# Patient Record
Sex: Female | Born: 1935
Health system: Southern US, Community
[De-identification: ages and names within clinical notes are randomized; demographics above are authoritative.]

## PROBLEM LIST (undated history)

## (undated) DIAGNOSIS — I10 Essential (primary) hypertension: Secondary | ICD-10-CM

## (undated) DIAGNOSIS — H269 Unspecified cataract: Secondary | ICD-10-CM

## (undated) DIAGNOSIS — G40909 Epilepsy, unspecified, not intractable, without status epilepticus: Secondary | ICD-10-CM

## (undated) DIAGNOSIS — M1990 Unspecified osteoarthritis, unspecified site: Secondary | ICD-10-CM

## (undated) DIAGNOSIS — R569 Unspecified convulsions: Secondary | ICD-10-CM

## (undated) HISTORY — DX: Unspecified cataract: H26.9

## (undated) HISTORY — DX: Essential (primary) hypertension: I10

## (undated) HISTORY — PX: TONSILLECTOMY: SUR1361

## (undated) HISTORY — DX: Unspecified osteoarthritis, unspecified site: M19.90

## (undated) HISTORY — DX: Unspecified convulsions: R56.9

## (undated) HISTORY — DX: Epilepsy, unspecified, not intractable, without status epilepticus: G40.909

---

## 2002-08-03 ENCOUNTER — Encounter: Payer: Self-pay | Admitting: *Deleted

## 2002-08-03 ENCOUNTER — Emergency Department (HOSPITAL_COMMUNITY): Admission: EM | Admit: 2002-08-03 | Discharge: 2002-08-03 | Payer: Self-pay | Admitting: *Deleted

## 2004-11-09 ENCOUNTER — Emergency Department (HOSPITAL_COMMUNITY): Admission: EM | Admit: 2004-11-09 | Discharge: 2004-11-10 | Payer: Self-pay | Admitting: Emergency Medicine

## 2004-11-16 ENCOUNTER — Ambulatory Visit: Payer: Self-pay | Admitting: Family Medicine

## 2004-11-21 ENCOUNTER — Encounter: Admission: RE | Admit: 2004-11-21 | Discharge: 2004-11-21 | Payer: Self-pay | Admitting: Family Medicine

## 2005-03-05 ENCOUNTER — Ambulatory Visit: Payer: Self-pay | Admitting: Family Medicine

## 2005-03-25 ENCOUNTER — Ambulatory Visit (HOSPITAL_COMMUNITY): Admission: RE | Admit: 2005-03-25 | Discharge: 2005-03-25 | Payer: Self-pay | Admitting: Family Medicine

## 2005-12-10 ENCOUNTER — Ambulatory Visit: Payer: Self-pay | Admitting: Family Medicine

## 2005-12-10 LAB — CONVERTED CEMR LAB
ALT: 14 units/L (ref 0–40)
AST: 19 units/L (ref 0–37)
Albumin: 3.9 g/dL (ref 3.5–5.2)
Alkaline Phosphatase: 77 units/L (ref 39–117)
BUN: 30 mg/dL — ABNORMAL HIGH (ref 6–23)
Basophils Absolute: 0.1 10*3/uL (ref 0.0–0.1)
Basophils Relative: 2.4 % — ABNORMAL HIGH (ref 0.0–1.0)
CO2: 28 meq/L (ref 19–32)
Calcium: 9.3 mg/dL (ref 8.4–10.5)
Chloride: 103 meq/L (ref 96–112)
Chol/HDL Ratio, serum: 3.5
Cholesterol: 150 mg/dL (ref 0–200)
Creatinine, Ser: 1.1 mg/dL (ref 0.4–1.2)
Eosinophil percent: 3.7 % (ref 0.0–5.0)
GFR calc non Af Amer: 52 mL/min
Glomerular Filtration Rate, Af Am: 64 mL/min/{1.73_m2}
Glucose, Bld: 110 mg/dL — ABNORMAL HIGH (ref 70–99)
HCT: 42.5 % (ref 36.0–46.0)
HDL: 42.9 mg/dL (ref >39.0)
Hemoglobin: 13.9 g/dL (ref 12.0–15.0)
Hgb A1c MFr Bld: 6.1 % — ABNORMAL HIGH (ref 4.6–6.0)
LDL Cholesterol: 100 mg/dL — ABNORMAL HIGH (ref 0–99)
Lymphocytes Relative: 36.8 % (ref 12.0–46.0)
MCHC: 32.6 g/dL (ref 30.0–36.0)
MCV: 91 fL (ref 78.0–100.0)
Monocytes Absolute: 0.5 10*3/uL (ref 0.2–0.7)
Monocytes Relative: 10 % (ref 3.0–11.0)
Neutro Abs: 2.2 10*3/uL (ref 1.4–7.7)
Neutrophils Relative %: 47.1 % (ref 43.0–77.0)
Platelets: 182 10*3/uL (ref 150–400)
Potassium: 4 meq/L (ref 3.5–5.1)
RBC: 4.67 M/uL (ref 3.87–5.11)
RDW: 11.7 % (ref 11.5–14.6)
Sodium: 140 meq/L (ref 135–145)
TSH: 2.12 microintl units/mL (ref 0.35–5.50)
Total Bilirubin: 0.7 mg/dL (ref 0.3–1.2)
Total Protein: 7.1 g/dL (ref 6.0–8.3)
Triglyceride fasting, serum: 38 mg/dL (ref 0–149)
VLDL: 8 mg/dL (ref 0–40)
WBC: 4.8 10*3/uL (ref 4.5–10.5)

## 2005-12-14 ENCOUNTER — Ambulatory Visit: Payer: Self-pay | Admitting: Family Medicine

## 2005-12-14 ENCOUNTER — Emergency Department (HOSPITAL_COMMUNITY): Admission: EM | Admit: 2005-12-14 | Discharge: 2005-12-14 | Payer: Self-pay | Admitting: Emergency Medicine

## 2006-04-12 ENCOUNTER — Encounter: Admission: RE | Admit: 2006-04-12 | Discharge: 2006-04-12 | Payer: Self-pay | Admitting: Neurology

## 2006-04-18 ENCOUNTER — Ambulatory Visit: Payer: Self-pay | Admitting: Family Medicine

## 2006-09-09 ENCOUNTER — Ambulatory Visit: Payer: Self-pay | Admitting: Family Medicine

## 2006-09-09 LAB — CONVERTED CEMR LAB
Bilirubin Urine: NEGATIVE
Blood in Urine, dipstick: NEGATIVE
Glucose, Urine, Semiquant: NEGATIVE
Ketones, urine, test strip: NEGATIVE
Nitrite: NEGATIVE
Protein, U semiquant: NEGATIVE
Specific Gravity, Urine: 1.025
Urobilinogen, UA: 0.2
WBC Urine, dipstick: NEGATIVE
pH: 5

## 2006-09-13 LAB — CONVERTED CEMR LAB
ALT: 12 units/L (ref 0–35)
AST: 16 units/L (ref 0–37)
Albumin: 3.6 g/dL (ref 3.5–5.2)
Alkaline Phosphatase: 76 units/L (ref 39–117)
BUN: 22 mg/dL (ref 6–23)
Basophils Absolute: 0 10*3/uL (ref 0.0–0.1)
Basophils Relative: 0.4 % (ref 0.0–1.0)
Bilirubin, Direct: 0.1 mg/dL (ref 0.0–0.3)
CO2: 29 meq/L (ref 19–32)
Calcium: 9.2 mg/dL (ref 8.4–10.5)
Chloride: 104 meq/L (ref 96–112)
Cholesterol: 152 mg/dL (ref 0–200)
Creatinine, Ser: 1.1 mg/dL (ref 0.4–1.2)
Eosinophils Absolute: 0.2 10*3/uL (ref 0.0–0.6)
Eosinophils Relative: 3.4 % (ref 0.0–5.0)
GFR calc Af Amer: 63 mL/min
GFR calc non Af Amer: 52 mL/min
Glucose, Bld: 128 mg/dL — ABNORMAL HIGH (ref 70–99)
HCT: 38.6 % (ref 36.0–46.0)
HDL: 39.1 mg/dL (ref >39.0)
Hemoglobin: 12.9 g/dL (ref 12.0–15.0)
LDL Cholesterol: 103 mg/dL — ABNORMAL HIGH (ref 0–99)
Lymphocytes Relative: 31.7 % (ref 12.0–46.0)
MCHC: 33.5 g/dL (ref 30.0–36.0)
MCV: 89.5 fL (ref 78.0–100.0)
Monocytes Absolute: 0.6 10*3/uL (ref 0.2–0.7)
Monocytes Relative: 12 % — ABNORMAL HIGH (ref 3.0–11.0)
Neutro Abs: 2.8 10*3/uL (ref 1.4–7.7)
Neutrophils Relative %: 52.5 % (ref 43.0–77.0)
Platelets: 222 10*3/uL (ref 150–400)
Potassium: 3.6 meq/L (ref 3.5–5.1)
RBC: 4.31 M/uL (ref 3.87–5.11)
RDW: 11.9 % (ref 11.5–14.6)
Sodium: 139 meq/L (ref 135–145)
TSH: 2.44 microintl units/mL (ref 0.35–5.50)
Total Bilirubin: 0.8 mg/dL (ref 0.3–1.2)
Total CHOL/HDL Ratio: 3.9
Total Protein: 6.7 g/dL (ref 6.0–8.3)
Triglycerides: 51 mg/dL (ref 0–149)
VLDL: 10 mg/dL (ref 0–40)
WBC: 5.3 10*3/uL (ref 4.5–10.5)

## 2007-01-18 ENCOUNTER — Ambulatory Visit: Payer: Self-pay | Admitting: Family Medicine

## 2007-01-18 DIAGNOSIS — I10 Essential (primary) hypertension: Secondary | ICD-10-CM | POA: Insufficient documentation

## 2007-01-18 DIAGNOSIS — M1990 Unspecified osteoarthritis, unspecified site: Secondary | ICD-10-CM

## 2007-03-20 ENCOUNTER — Ambulatory Visit: Payer: Self-pay | Admitting: Family Medicine

## 2007-07-06 ENCOUNTER — Telehealth: Payer: Self-pay | Admitting: Family Medicine

## 2007-07-07 ENCOUNTER — Ambulatory Visit: Payer: Self-pay | Admitting: Family Medicine

## 2008-03-22 IMAGING — CR DG CERVICAL SPINE COMPLETE 4+V
7 series · 7 of 7 positions shown · non-contrast
Comparison: None.

CLINICAL DATA: Bilateral neck and shoulder pain, especially turning head.  No specific injury.
 CERVICAL SPINE ? FIVE VIEWS:

[view not recorded (1 of 7)]
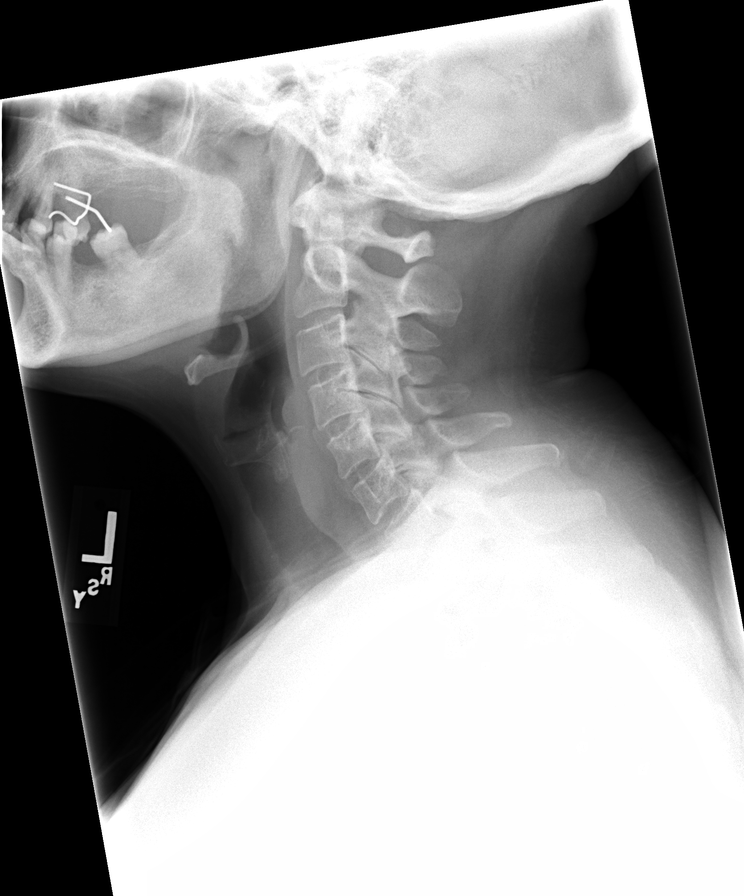

[view not recorded (2 of 7)]
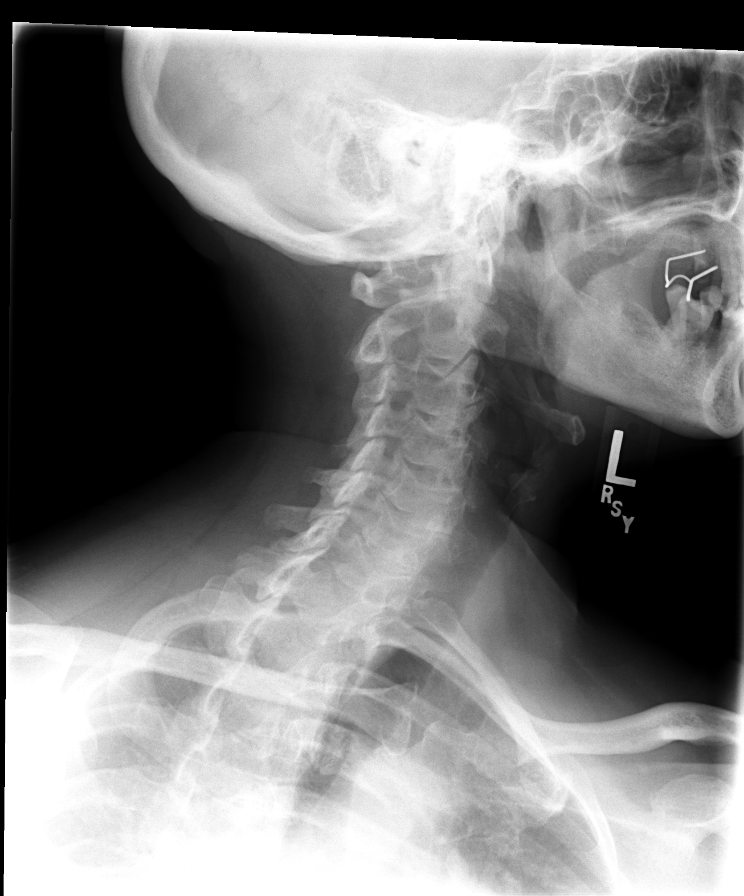

[view not recorded (3 of 7)]
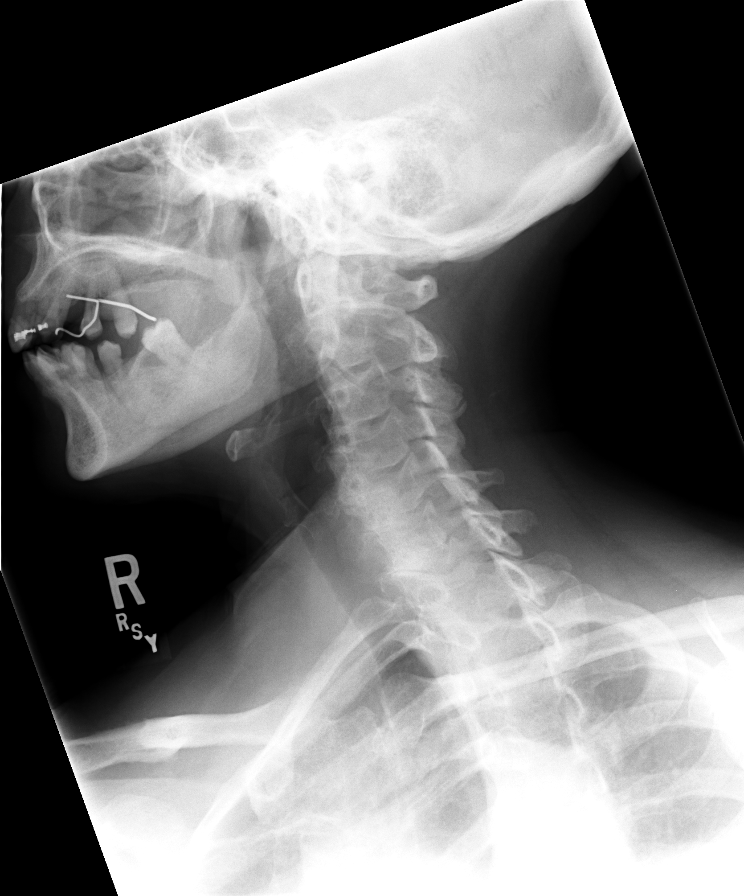

[view not recorded (4 of 7)]
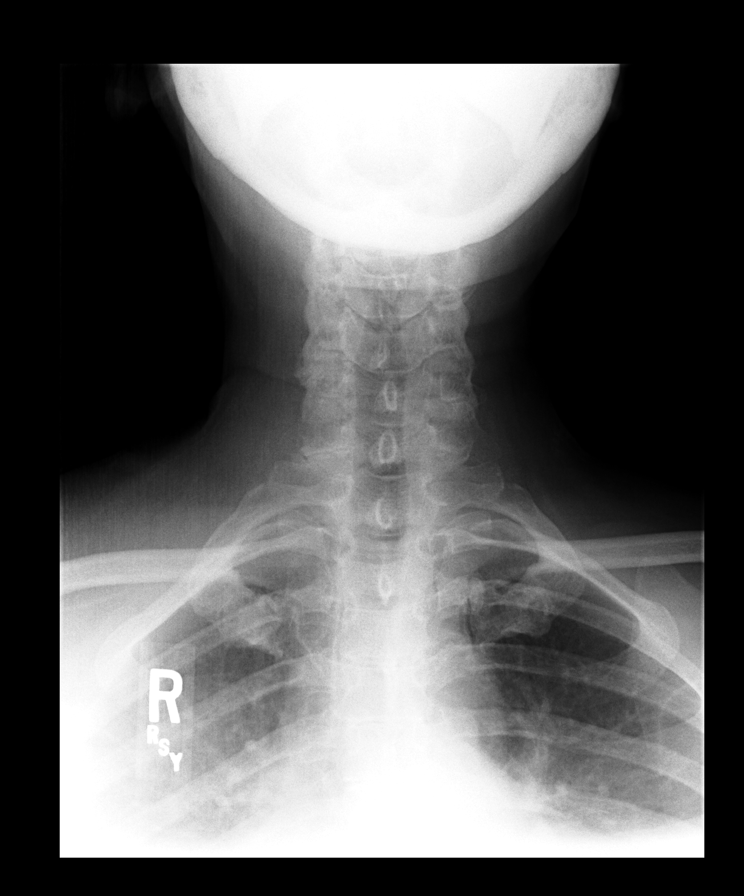

[view not recorded (5 of 7)]
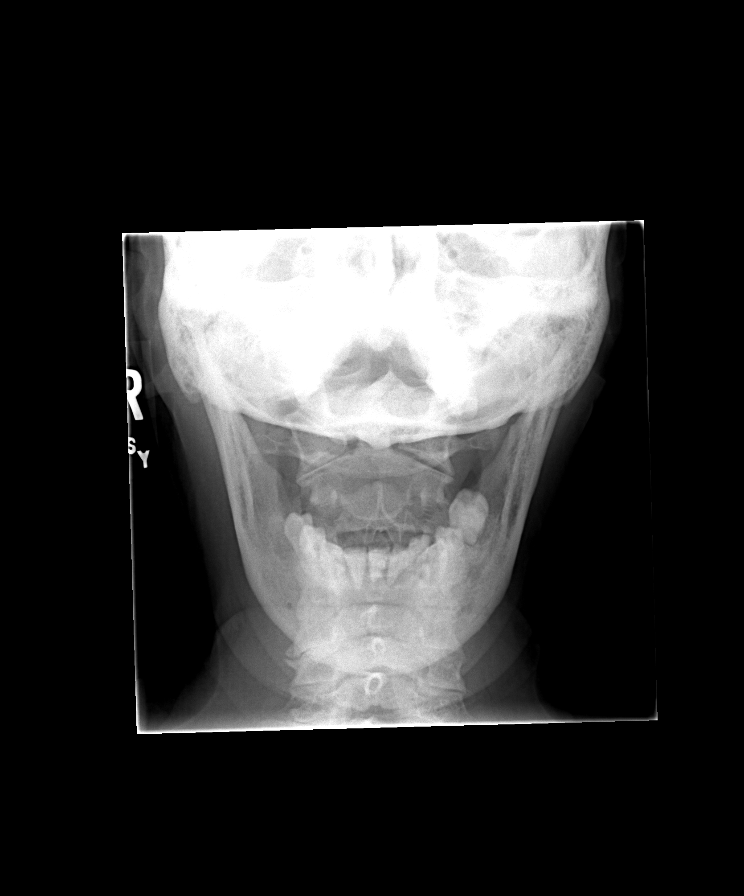

[view not recorded (6 of 7)]
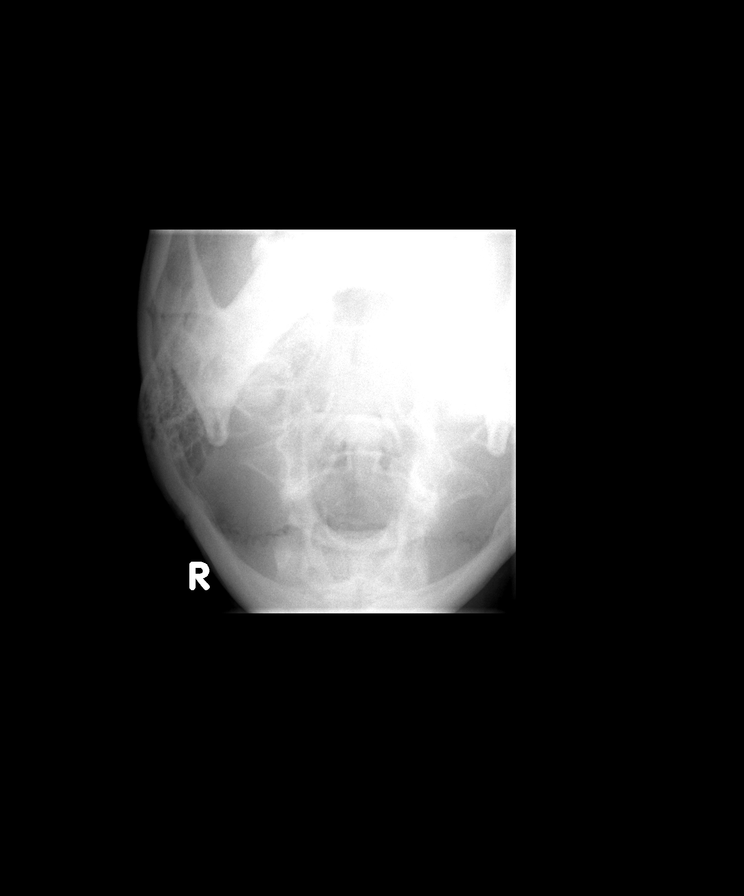

[view not recorded (7 of 7)]
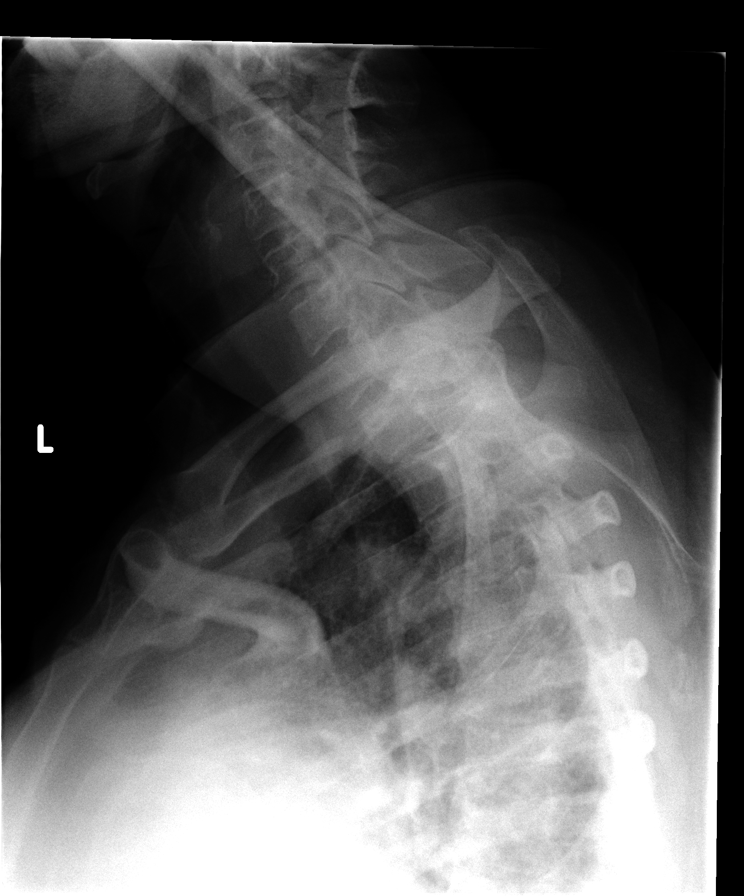

[7 of 7 positions shown; findings below may reference images not displayed]

FINDINGS: Cervical disk spaces and posterior vertebral alignment appear maintained with no abnormal prevertebral soft tissue swelling.  Right greater than left mid cervical facet degenerative joint disease is seen with slight uncinate degenerative joint disease at C6-7.  No significant neural foraminal stenosis is noted.
IMPRESSION: 1.  Slight facet and uncinate degenerative joint disease, as described.
 2.  Otherwise no significant abnormality.

## 2008-07-10 ENCOUNTER — Ambulatory Visit (HOSPITAL_COMMUNITY): Admission: RE | Admit: 2008-07-10 | Discharge: 2008-07-10 | Payer: Self-pay | Admitting: Family Medicine

## 2009-02-25 ENCOUNTER — Ambulatory Visit: Payer: Self-pay | Admitting: Family Medicine

## 2009-02-25 DIAGNOSIS — R569 Unspecified convulsions: Secondary | ICD-10-CM

## 2009-02-26 ENCOUNTER — Encounter (INDEPENDENT_AMBULATORY_CARE_PROVIDER_SITE_OTHER): Payer: Self-pay | Admitting: *Deleted

## 2009-03-11 ENCOUNTER — Telehealth: Payer: Self-pay | Admitting: Family Medicine

## 2009-03-25 ENCOUNTER — Encounter (INDEPENDENT_AMBULATORY_CARE_PROVIDER_SITE_OTHER): Payer: Self-pay | Admitting: *Deleted

## 2009-03-26 ENCOUNTER — Ambulatory Visit: Payer: Self-pay | Admitting: Gastroenterology

## 2009-04-16 ENCOUNTER — Ambulatory Visit: Payer: Self-pay | Admitting: Gastroenterology

## 2009-04-16 ENCOUNTER — Telehealth: Payer: Self-pay | Admitting: Gastroenterology

## 2009-07-11 ENCOUNTER — Ambulatory Visit (HOSPITAL_COMMUNITY): Admission: RE | Admit: 2009-07-11 | Discharge: 2009-07-11 | Payer: Self-pay | Admitting: Family Medicine

## 2009-07-11 LAB — HM MAMMOGRAPHY

## 2009-08-06 ENCOUNTER — Telehealth: Payer: Self-pay | Admitting: Family Medicine

## 2010-02-08 LAB — CONVERTED CEMR LAB
ALT: 14 units/L (ref 0–35)
AST: 16 units/L (ref 0–37)
Albumin: 3.9 g/dL (ref 3.5–5.2)
Alkaline Phosphatase: 76 units/L (ref 39–117)
BUN: 25 mg/dL — ABNORMAL HIGH (ref 6–23)
Basophils Absolute: 0 10*3/uL (ref 0.0–0.1)
Basophils Relative: 0.6 % (ref 0.0–3.0)
Bilirubin Urine: NEGATIVE
Bilirubin, Direct: 0.1 mg/dL (ref 0.0–0.3)
Blood in Urine, dipstick: NEGATIVE
CO2: 32 meq/L (ref 19–32)
Calcium: 9.9 mg/dL (ref 8.4–10.5)
Chloride: 104 meq/L (ref 96–112)
Cholesterol: 153 mg/dL (ref 0–200)
Creatinine, Ser: 1.1 mg/dL (ref 0.4–1.2)
Eosinophils Absolute: 0.2 10*3/uL (ref 0.0–0.7)
Eosinophils Relative: 3.4 % (ref 0.0–5.0)
GFR calc non Af Amer: 62.82 mL/min (ref >60)
Glucose, Bld: 131 mg/dL — ABNORMAL HIGH (ref 70–99)
Glucose, Urine, Semiquant: NEGATIVE
HCT: 41.1 % (ref 36.0–46.0)
HDL: 44.7 mg/dL (ref >39.00)
Hemoglobin: 13.2 g/dL (ref 12.0–15.0)
Ketones, urine, test strip: NEGATIVE
LDL Cholesterol: 96 mg/dL (ref 0–99)
Lymphocytes Relative: 34.4 % (ref 12.0–46.0)
Lymphs Abs: 1.8 10*3/uL (ref 0.7–4.0)
MCHC: 32.1 g/dL (ref 30.0–36.0)
MCV: 92.6 fL (ref 78.0–100.0)
Monocytes Absolute: 0.6 10*3/uL (ref 0.1–1.0)
Monocytes Relative: 11.6 % (ref 3.0–12.0)
Neutro Abs: 2.5 10*3/uL (ref 1.4–7.7)
Neutrophils Relative %: 50 % (ref 43.0–77.0)
Nitrite: NEGATIVE
Platelets: 209 10*3/uL (ref 150.0–400.0)
Potassium: 4 meq/L (ref 3.5–5.1)
Protein, U semiquant: NEGATIVE
RBC: 4.44 M/uL (ref 3.87–5.11)
RDW: 12 % (ref 11.5–14.6)
Sodium: 142 meq/L (ref 135–145)
Specific Gravity, Urine: 1.025
TSH: 2.21 microintl units/mL (ref 0.35–5.50)
Total Bilirubin: 0.5 mg/dL (ref 0.3–1.2)
Total CHOL/HDL Ratio: 3
Total Protein: 7.3 g/dL (ref 6.0–8.3)
Triglycerides: 63 mg/dL (ref 0.0–149.0)
Urobilinogen, UA: 0.2
VLDL: 12.6 mg/dL (ref 0.0–40.0)
WBC Urine, dipstick: NEGATIVE
WBC: 5.1 10*3/uL (ref 4.5–10.5)
pH: 5

## 2010-02-10 NOTE — Progress Notes (Signed)
Summary: Rx  Phone Note Call from Patient   Caller: Patient Call For: Nelwyn Salisbury MD Summary of Call: wants order [facility]com[facility] Initial call taken by: VM  Follow-up for Phone Call        she can buy these OTC without a rx Follow-up by: Nelwyn Salisbury MD,  August 06, 2009 4:37 PM  Additional Follow-up for Phone Call Additional follow up Details #1::        Phone Call Completed Additional Follow-up by: Raechel Ache, RN,  August 06, 2009 4:45 PM

## 2010-02-10 NOTE — Progress Notes (Signed)
Summary: cx fee?  Phone Note Call from Patient   Caller: Patient Call For: Dr. Russella Dar Reason for Call: Talk to Doctor Summary of Call: pt cx'ed  COL for tomorrow at 9:30am... reason is that she "just isnt up to it"... informed pt that she may be charged a cx fee and pt replied "if Im charged, Im charged" Dr. Russella Dar, would you like to charge this pt a cx fee? Initial call taken by: Vallarie Mare,  April 16, 2009 11:06 AM  Follow-up for Phone Call        Yes Follow-up by: Meryl Dare MD Clementeen Graham,  April 16, 2009 11:59 AM  Additional Follow-up for Phone Call Additional follow up Details #1::        Patient BILLED. Additional Follow-up by: Leanor Kail [facility],  April 17, 2009 2:08 PM

## 2010-02-10 NOTE — Progress Notes (Signed)
Summary: reaction to metformin?  Phone Note Call from Patient Call back at [facility] Phone [phone]   Caller: Patient Call For: fry Summary of Call: pt taking benicar and lamotrigine.  Now taking metformin 500mg  her hands are numb when she wakes up and her legs feels like she has 2 rubberbands around her them and she can't hardly walk.  Told pt to not take med until consulted with physician Initial call taken by: Alfred Levins, CMA,  March 11, 2009 11:17 AM  Follow-up for Phone Call        I do not see how this could be related to Metformin, but go ahead and stop it for now. watch your diet closely. See if these symptoms go away Follow-up by: Nelwyn Salisbury MD,  March 11, 2009 1:10 PM  Additional Follow-up for Phone Call Additional follow up Details #1::        Pt aware but also instructed pt to take her BS daily to make sure that her sugar is stable Additional Follow-up by: Alfred Levins, CMA,  March 11, 2009 1:15 PM

## 2010-02-10 NOTE — Letter (Signed)
Summary: [facility] Instructions  [facility]  [address] [place], [place] [address]   Phone: [phone]  Fax: [phone]       Debra Carlson    21-Jun-1992    MRN: [id number]        Procedure Day /Date:  Thursday  04/17/09     Arrival Time:  8:30am     Procedure Time:  9:30am     Location of Procedure:                    _ X_  [facility] (4th Floor)                        PREPARATION FOR COLONOSCOPY WITH MOVIPREP   Starting 5 days prior to your procedure  Saturday 04/02  do not eat nuts, seeds, popcorn, corn, beans, peas,  salads, or any raw vegetables.  Do not take any fiber supplements (e.g. Metamucil, Citrucel, and Benefiber).  THE DAY BEFORE YOUR PROCEDURE         DATE: 04/06   DAY: Wednesday  1.  Drink clear liquids the entire day-NO SOLID FOOD  2.  Do not drink anything colored red or purple.  Avoid juices with pulp.  No orange juice.  3.  Drink at least 64 oz. (8 glasses) of fluid/clear liquids during the day to prevent dehydration and help the prep work efficiently.  CLEAR LIQUIDS INCLUDE: Water Jello Ice Popsicles Tea (sugar ok, no milk/cream) Powdered fruit flavored drinks Coffee (sugar ok, no milk/cream) Gatorade Juice: apple, white grape, white cranberry  Lemonade Clear bullion, consomm, broth Carbonated beverages (any kind) Strained chicken noodle soup Hard Candy                             4.  In the morning, mix first dose of MoviPrep solution:    Empty 1 Pouch A and 1 Pouch B into the disposable container    Add lukewarm drinking water to the top line of the container. Mix to dissolve    Refrigerate (mixed solution should be used within 24 hrs)  5.  Begin drinking the prep at 5:00 p.m. The MoviPrep container is divided by 4 marks.   Every 15 minutes drink the solution down to the next mark (approximately 8 oz) until the full liter is complete.   6.  Follow completed prep with 16 oz of clear liquid of your  choice (Nothing red or purple).  Continue to drink clear liquids until bedtime.  7.  Before going to bed, mix second dose of MoviPrep solution:    Empty 1 Pouch A and 1 Pouch B into the disposable container    Add lukewarm drinking water to the top line of the container. Mix to dissolve    Refrigerate  THE DAY OF YOUR PROCEDURE      DATE:  04/07 DAY: Thursday  Beginning at  4:30 a.m. (5 hours before procedure):         1. Every 15 minutes, drink the solution down to the next mark (approx 8 oz) until the full liter is complete.  2. Follow completed prep with 16 oz. of clear liquid of your choice.    3. You may drink clear liquids until 7:30am  (2 HOURS BEFORE PROCEDURE).   MEDICATION INSTRUCTIONS  Unless otherwise instructed, you should take regular prescription medications with a small sip of water  as early as possible the morning of your procedure.   Additional medication instructions: Do not take Hyzaar day of procedure.         OTHER INSTRUCTIONS  You will need a responsible adult at least 73 years of age to accompany you and drive you home.   This person must remain in the waiting room during your procedure.  Wear loose fitting clothing that is easily removed.  Leave jewelry and other valuables at home.  However, you may wish to bring a book to read or  an iPod/MP3 player to listen to music as you wait for your procedure to start.  Remove all body piercing jewelry and leave at home.  Total time from sign-in until discharge is approximately 2-3 hours.  You should go home directly after your procedure and rest.  You can resume normal activities the  day after your procedure.  The day of your procedure you should not:   Drive   Make legal decisions   Operate machinery   Drink alcohol   Return to work  You will receive specific instructions about eating, activities and medications before you leave.    The above instructions have been reviewed and  explained to me by   Ezra Sites RN  March 26, 2009 10:55 AM    I fully understand and can verbalize these instructions _____________________________ Date _________

## 2010-02-10 NOTE — Miscellaneous (Signed)
Summary: [facility]  Clinical Lists Changes  Medications: Added new medication of MOVIPREP 100 GM  SOLR (PEG-KCL-NACL-NASULF-NA ASC-C) As per prep instructions. - Signed Rx of MOVIPREP 100 GM  SOLR (PEG-KCL-NACL-NASULF-NA ASC-C) As per prep instructions.;  #1 x 0;  Signed;  Entered by: Ezra Sites RN;  Authorized by: Meryl Dare MD [facility];  Method used: [facility]  [address] [id number] [address] [place], [place]  [address], Ph: [phone] or [phone], Fax: [phone] Observations: Added new observation of NKA: T (03/26/2009 10:31)    Prescriptions: MOVIPREP 100 GM  SOLR (PEG-KCL-NACL-NASULF-NA ASC-C) As per prep instructions.  #1 x 0   Entered by:   Ezra Sites RN   Authorized by:   Meryl Dare MD [facility]   Signed by:   Ezra Sites RN on 03/26/2009   Method used:   [facility]        [facility]  [address] [id number]       [address]       [place], [place]  [address]       Ph: [phone] or [phone]       Fax: [phone]   RxID:   [id number]

## 2010-02-10 NOTE — Assessment & Plan Note (Signed)
Summary: emp-will fast//ccm   Vital Signs:  Patient profile:   75 year old female Height:      62 inches Weight:      194 pounds BMI:     35.61 Temp:     98.0 degrees F oral Pulse rate:   110 / minute BP sitting:   114 / 70  (left arm) Cuff size:   large  Vitals Entered By: Alfred Levins, CMA (February 25, 2009 9:20 AM) CC: cpx, fasting   History of Present Illness: 75 yr old female for cpx. She feels good in general. No concerns. Her insurance wants her to be on generic meds if possible. For some reason she has never had a colonoscopy.   Current Medications (verified): 1)  Benicar Hct 40-25 Mg  Tabs (Olmesartan Medoxomil-Hctz) .... Once Daily 2)  Aspir-Low 81 Mg Tbec (Aspirin) .Marland Kitchen.. 1 Once Daily After Meal 3)  Lamictal 25 Mg  Tabs (Lamotrigine) .... 2 Every Am and 2 Every Pm 4)  Meclizine Hcl 25 Mg Tabs (Meclizine Hcl) .Marland Kitchen.. 1 By Mouth Two Times A Day As Needed Vertigo  Allergies (verified): No Known Drug Allergies  Past History:  Past Medical History: Hypertension Osteoarthritis Seizure disorder, single seizure in 2005, sees Dr. Kelli Hope  Past Surgical History: Reviewed history from 01/18/2007 and no changes required. Tonsillectomy  Family History: Reviewed history from 01/18/2007 and no changes required. Family History of Colon CA 1st degree relative <60  Social History: Reviewed history from 01/18/2007 and no changes required. Married Never Smoked Alcohol use-no Drug use-no  Review of Systems  The patient denies anorexia, fever, weight loss, weight gain, vision loss, decreased hearing, hoarseness, chest pain, syncope, dyspnea on exertion, peripheral edema, prolonged cough, headaches, hemoptysis, abdominal pain, melena, hematochezia, severe indigestion/heartburn, hematuria, incontinence, genital sores, muscle weakness, suspicious skin lesions, transient blindness, difficulty walking, depression, unusual weight change, abnormal bleeding, enlarged lymph  nodes, angioedema, breast masses, and testicular masses.    Physical Exam  General:  overweight-appearing.   Head:  Normocephalic and atraumatic without obvious abnormalities. No apparent alopecia or balding. Eyes:  No corneal or conjunctival inflammation noted. EOMI. Perrla. Funduscopic exam benign, without hemorrhages, exudates or papilledema. Vision grossly normal. Ears:  External ear exam shows no significant lesions or deformities.  Otoscopic examination reveals clear canals, tympanic membranes are intact bilaterally without bulging, retraction, inflammation or discharge. Hearing is grossly normal bilaterally. Nose:  External nasal examination shows no deformity or inflammation. Nasal mucosa are pink and moist without lesions or exudates. Mouth:  Oral mucosa and oropharynx without lesions or exudates.  Teeth in good repair. Neck:  No deformities, masses, or tenderness noted. Chest Wall:  No deformities, masses, or tenderness noted. Breasts:  No mass, nodules, thickening, tenderness, bulging, retraction, inflamation, nipple discharge or skin changes noted.   Lungs:  Normal respiratory effort, chest expands symmetrically. Lungs are clear to auscultation, no crackles or wheezes. Heart:  Normal rate and regular rhythm. S1 and S2 normal without gallop, murmur, click, rub or other extra sounds. EKG normal  Abdomen:  Bowel sounds positive,abdomen soft and non-tender without masses, organomegaly or hernias noted. Rectal:  No external abnormalities noted. Normal sphincter tone. No rectal masses or tenderness. Heme neg. Genitalia:  no external lesions.   Msk:  No deformity or scoliosis noted of thoracic or lumbar spine.   Pulses:  R and L carotid,radial,femoral,dorsalis pedis and posterior tibial pulses are full and equal bilaterally Extremities:  No clubbing, cyanosis, edema, or deformity noted with normal full  range of motion of all joints.   Neurologic:  No cranial nerve deficits noted. Station  and gait are normal. Plantar reflexes are down-going bilaterally. DTRs are symmetrical throughout. Sensory, motor and coordinative functions appear intact. Skin:  Intact without suspicious lesions or rashes Cervical Nodes:  No lymphadenopathy noted Axillary Nodes:  No palpable lymphadenopathy Inguinal Nodes:  No significant adenopathy Psych:  Cognition and judgment appear intact. Alert and cooperative with normal attention span and concentration. No apparent delusions, illusions, hallucinations    Impression & Recommendations:  Problem # 1:  SEIZURE DISORDER (ICD-780.39)  Her updated medication list for this problem includes:    Lamictal 25 Mg Tabs (Lamotrigine) .Marland Kitchen... 2 every am and 2 every pm  Problem # 2:  OSTEOARTHRITIS (ICD-715.90)  Her updated medication list for this problem includes:    Aspir-low 81 Mg Tbec (Aspirin) .Marland Kitchen... 1 once daily after meal  Problem # 3:  HYPERTENSION (ICD-401.9)  The following medications were removed from the medication list:    Benicar Hct 40-25 Mg Tabs (Olmesartan medoxomil-hctz) ..... Once daily Her updated medication list for this problem includes:    Hyzaar 100-25 Mg Tabs (Losartan potassium-hctz) ..... Once daily  Orders: UA Dipstick w/o Micro (automated)  (81003) EKG w/ Interpretation (93000) Venipuncture ([address]) TLB-Lipid Panel (80061-LIPID) TLB-BMP (Basic Metabolic Panel-BMET) (80048-METABOL) TLB-CBC Platelet - w/Differential (85025-CBCD) TLB-Hepatic/Liver Function Pnl (80076-HEPATIC) TLB-TSH (Thyroid Stimulating Hormone) (84443-TSH)  Problem # 4:  FAMILY HISTORY OF COLON CA 1ST DEGREE RELATIVE <60 (ICD-V16.0)  Orders: Hemoccult Guaiac-1 spec.(in office) (82270)  Complete Medication List: 1)  Aspir-low 81 Mg Tbec (Aspirin) .Marland Kitchen.. 1 once daily after meal 2)  Lamictal 25 Mg Tabs (Lamotrigine) .... 2 every am and 2 every pm 3)  Meclizine Hcl 25 Mg Tabs (Meclizine hcl) .Marland Kitchen.. 1 by mouth two times a day as needed vertigo 4)  Hyzaar 100-25  Mg Tabs (Losartan potassium-hctz) .... Once daily  Other Orders: Gastroenterology Referral (GI)  Patient Instructions: 1)  It is important that you exercise reguarly at least 20 minutes 5 times a week. If you develop chest pain, have severe difficulty breathing, or feel very tired, stop exercising immediately and seek medical attention.  2)  You need to lose weight. Consider a lower calorie diet and regular exercise.  3)  Schedule a colonoscopy/ sigmoidoscopy to help detect colon cancer.  4)  Get labs today. Prescriptions: HYZAAR 100-25 MG TABS (LOSARTAN POTASSIUM-HCTZ) once daily  #30 x 11   Entered and Authorized by:   Nelwyn Salisbury MD   Signed by:   Nelwyn Salisbury MD on 02/25/2009   Method used:   [facility]  [address] [id number]       [address]       [place], [place]  [address]       Ph: [phone] or [phone]       Fax: [phone]   RxID:   [id number]   Preventive Care Screening  Mammogram:    Date:  06/11/2008    Results:  normal    Laboratory Results   Urine Tests    Routine Urinalysis   Color: yellow Appearance: Clear Glucose: negative   (Normal Range: Negative) Bilirubin: negative   (Normal Range: Negative) Ketone: negative   (Normal Range: Negative) Spec. Gravity: 1.025   (Normal Range: 1.003-1.035) Blood: negative   (Normal Range: Negative) pH: 5.0   (Normal Range: 5.0-8.0) Protein: negative   (  Normal Range: Negative) Urobilinogen: 0.2   (Normal Range: 0-1) Nitrite: negative   (Normal Range: Negative) Leukocyte Esterace: negative   (Normal Range: Negative)    Comments: Rita Ohara  February 25, 2009 11:39 AM

## 2010-02-10 NOTE — Letter (Signed)
Summary: Previsit letter  [facility]  [address] [place], [place] [address]   Phone: [phone]  Fax: [phone]       02/26/2009 MRN: [id number]  [facility] [address] [place], [place]  [address]  Dear Ms. Que,  Welcome to the Gastroenterology Division at [facility].    You are scheduled to see a nurse for your pre-procedure visit on 03/26/2009 at 10:30AM on the 3rd floor at [facility], [address]  We ask that you try to arrive at our office 15 minutes prior to your appointment time to allow for check-in.  Your nurse visit will consist of discussing your medical and surgical history, your immediate family medical history, and your medications.    Please bring a complete list of all your medications or, if you prefer, bring the medication bottles and we will list them.  We will need to be aware of both prescribed and over the counter drugs.  We will need to know exact dosage information as well.  If you are on blood thinners (Coumadin, Plavix, Aggrenox, Ticlid, etc.) please call our office today/prior to your appointment, as we need to consult with your physician about holding your medication.   Please be prepared to read and sign documents such as consent forms, a financial agreement, and acknowledgement forms.  If necessary, and with your consent, a friend or relative is welcome to sit-in on the nurse visit with you.  Please bring your insurance card so that we may make a copy of it.  If your insurance requires a referral to see a specialist, please bring your referral form from your primary care physician.  No co-pay is required for this nurse visit.     If you cannot keep your appointment, please call [phone] to cancel or reschedule prior to your appointment date.  This allows [place] the opportunity to schedule an appointment for another patient in need of care.    Thank you for choosing [facility] for your  medical needs.  We appreciate the opportunity to care for you.  Please visit [place] at our website  to learn more about our practice.                     Sincerely.                                                                                                                   The Gastroenterology Division

## 2010-04-08 ENCOUNTER — Other Ambulatory Visit: Payer: Self-pay | Admitting: Family Medicine

## 2010-07-06 ENCOUNTER — Other Ambulatory Visit: Payer: Self-pay | Admitting: Family Medicine

## 2010-07-06 DIAGNOSIS — Z1231 Encounter for screening mammogram for malignant neoplasm of breast: Secondary | ICD-10-CM

## 2010-07-14 ENCOUNTER — Ambulatory Visit (HOSPITAL_COMMUNITY)
Admission: RE | Admit: 2010-07-14 | Discharge: 2010-07-14 | Disposition: A | Discharge status: Home / Self Care | Payer: Medicare HMO | Source: Ambulatory Visit | Attending: Family Medicine | Admitting: Family Medicine

## 2010-07-14 DIAGNOSIS — Z1231 Encounter for screening mammogram for malignant neoplasm of breast: Secondary | ICD-10-CM | POA: Insufficient documentation

## 2010-08-29 ENCOUNTER — Other Ambulatory Visit: Payer: Self-pay | Admitting: Family Medicine

## 2010-10-07 ENCOUNTER — Telehealth: Payer: Self-pay | Admitting: Family Medicine

## 2010-10-07 NOTE — Telephone Encounter (Signed)
Pt needs ov hip pain . Can I use sda slot this week?

## 2010-10-08 ENCOUNTER — Encounter: Payer: Self-pay | Admitting: Family Medicine

## 2010-10-08 ENCOUNTER — Ambulatory Visit (INDEPENDENT_AMBULATORY_CARE_PROVIDER_SITE_OTHER): Payer: Medicare HMO | Admitting: Family Medicine

## 2010-10-08 VITALS — BP 118/76 | HR 102 | Temp 98.1°F | Wt 179.0 lb

## 2010-10-08 DIAGNOSIS — S7000XA Contusion of unspecified hip, initial encounter: Secondary | ICD-10-CM

## 2010-10-08 MED ORDER — HYDROCODONE-ACETAMINOPHEN 5-500 MG PO TABS: 1.0000 | ORAL_TABLET | Freq: Four times a day (QID) | ORAL | Status: AC | PRN

## 2010-10-08 NOTE — Telephone Encounter (Addendum)
lmom for pt to callback to sch ov. Pt is sch for 10-08-10 11am

## 2010-10-09 ENCOUNTER — Encounter: Payer: Self-pay | Admitting: Family Medicine

## 2010-10-09 NOTE — Progress Notes (Signed)
  Subjective:    Patient ID: Debra Carlson, female    DOB: [birth year], 75 y.o.   MRN: [id number]  HPI Here for lower back pain and left hip pain after a fall several days ago. She lost her balance and fell, landing on the left hip area. She has been stiff and sore since then, but she can walk and drive her car. Using Motrin and heat.   Review of Systems  Constitutional: Negative.   Musculoskeletal: Positive for back pain.       Objective:   Physical Exam  Constitutional: She appears well-developed and well-nourished.       Gets on and off the exam table easily  Musculoskeletal:       Mildly tender in the left lower back but no ecchymosis is seen. ROM is full of the hip and the spine          Assessment & Plan:  Hip contusion. Use pain meds and heat. This should resolve in a week or two. Recheck prn

## 2010-11-01 ENCOUNTER — Other Ambulatory Visit: Payer: Self-pay | Admitting: Internal Medicine

## 2010-11-02 NOTE — Telephone Encounter (Signed)
Dr Fry pt 

## 2010-11-03 NOTE — Telephone Encounter (Signed)
Can we refill, last seen 07/30/09?

## 2011-04-14 ENCOUNTER — Telehealth: Payer: Self-pay | Admitting: Family Medicine

## 2011-04-14 NOTE — Telephone Encounter (Signed)
Spoke with pt and she is going to schedule a office visit for next week and will try something over the counter for coughing.

## 2011-04-14 NOTE — Telephone Encounter (Signed)
Patient called stating that she would like to have something called in for her cough. Patient is aware the MD is out of the office. Please advise.

## 2011-04-19 ENCOUNTER — Ambulatory Visit (INDEPENDENT_AMBULATORY_CARE_PROVIDER_SITE_OTHER): Payer: Medicare HMO | Admitting: Family Medicine

## 2011-04-19 ENCOUNTER — Encounter: Payer: Self-pay | Admitting: Family Medicine

## 2011-04-19 VITALS — BP 116/76 | HR 105 | Temp 97.7°F | Wt 185.0 lb

## 2011-04-19 DIAGNOSIS — J40 Bronchitis, not specified as acute or chronic: Secondary | ICD-10-CM

## 2011-04-19 DIAGNOSIS — M25569 Pain in unspecified knee: Secondary | ICD-10-CM

## 2011-04-19 MED ORDER — AZITHROMYCIN 250 MG PO TABS: ORAL_TABLET | ORAL | Status: AC

## 2011-04-19 MED ORDER — HYDROCODONE-HOMATROPINE 5-1.5 MG/5ML PO SYRP: 5.0000 mL | ORAL_SOLUTION | ORAL | Status: AC | PRN

## 2011-04-20 ENCOUNTER — Encounter: Payer: Self-pay | Admitting: Family Medicine

## 2011-04-20 NOTE — Progress Notes (Signed)
  Subjective:    Patient ID: Debra Carlson, female    DOB: 1935-12-09, 76 y.o.   MRN: [id number]  HPI Here for 2 things. First she has had one week of PND, ST, chest congestion and coughing up yellow sputum. No fever. Also she has had knee pain for years, but the left knee is getting much worse. She takes Ibuprofen or Aleve, but it is harder to get around.    Review of Systems  Constitutional: Negative.   HENT: Positive for congestion and postnasal drip.   Eyes: Negative.   Respiratory: Positive for cough.   Musculoskeletal: Positive for arthralgias.       Objective:   Physical Exam  Constitutional: She appears well-developed and well-nourished.  HENT:  Right Ear: External ear normal.  Left Ear: External ear normal.  Nose: Nose normal.  Mouth/Throat: Oropharynx is clear and moist. No oropharyngeal exudate.  Eyes: Conjunctivae are normal.  Cardiovascular: Normal rate, regular rhythm, normal heart sounds and intact distal pulses.   Pulmonary/Chest: Effort normal. No respiratory distress. She has no wheezes. She has no rales.       Scattered rhonchi  Lymphadenopathy:    She has no cervical adenopathy.          Assessment & Plan:  Use a Zpack and Mucinex. Refer to Orthopedics

## 2011-07-07 ENCOUNTER — Other Ambulatory Visit: Payer: Self-pay | Admitting: Family Medicine

## 2011-07-07 DIAGNOSIS — Z1231 Encounter for screening mammogram for malignant neoplasm of breast: Secondary | ICD-10-CM

## 2011-07-30 ENCOUNTER — Ambulatory Visit (HOSPITAL_COMMUNITY)
Admission: RE | Admit: 2011-07-30 | Discharge: 2011-07-30 | Disposition: A | Discharge status: Home / Self Care | Payer: Medicare HMO | Source: Ambulatory Visit | Attending: Family Medicine | Admitting: Family Medicine

## 2011-07-30 DIAGNOSIS — Z1231 Encounter for screening mammogram for malignant neoplasm of breast: Secondary | ICD-10-CM | POA: Insufficient documentation

## 2012-04-26 ENCOUNTER — Other Ambulatory Visit: Payer: Self-pay | Admitting: Neurology

## 2012-05-31 ENCOUNTER — Encounter: Payer: Self-pay | Admitting: Nurse Practitioner

## 2012-05-31 ENCOUNTER — Ambulatory Visit (INDEPENDENT_AMBULATORY_CARE_PROVIDER_SITE_OTHER): Payer: Medicare HMO | Admitting: Nurse Practitioner

## 2012-05-31 VITALS — BP 132/80 | HR 103 | Ht 62.5 in | Wt 194.0 lb

## 2012-05-31 DIAGNOSIS — I10 Essential (primary) hypertension: Secondary | ICD-10-CM

## 2012-05-31 DIAGNOSIS — R569 Unspecified convulsions: Secondary | ICD-10-CM

## 2012-05-31 MED ORDER — LAMOTRIGINE 25 MG PO TABS: 50.0000 mg | ORAL_TABLET | Freq: Two times a day (BID) | ORAL | Status: DC

## 2012-05-31 NOTE — Patient Instructions (Addendum)
Patient to continue Lamictal at current dose. Will call in refills Call for any seizure activity Will f/u in 1 year

## 2012-05-31 NOTE — Progress Notes (Signed)
HPI: Patient returns for followup after last visit with Dr. Terrace Arabia 01/13/2011. She has a history of generalized seizure in February 2007. She had had previous episodes of what she called vertigo, all episodes had similar semiology she felt strange and dizziness afterwards with mild confusion these episodes can last 5-15 minutes and then she would come around and be slow to respond. She was placed on Dilantin but could not tolerate the medication. She is currently on Lamictal without side effects and further episodes. She continues to drive without difficulty. EEG in the past have been abnormal with an irritable focus in the left temple area. MRI of the brain was normal  ROS:  Joint pain  Physical Exam General: well developed, well nourished, seated, in no evident distress Head: head normocephalic and atraumatic. Oropharynx benign Neck: supple with no carotid  bruits Cardiovascular: regular rate and rhythm, no murmurs  Neurologic Exam Mental Status: Awake and fully alert. Oriented to place and time. Follows all commands, speech and language are normal .  Cranial Nerves: Pupils equal, briskly reactive to light. Extraocular movements full without nystagmus. Visual fields full to confrontation. Hearing intact and symmetric to finger snap. Facial sensation intact. Face, tongue, palate move normally and symmetrically. Neck flexion and extension normal.  Motor: Normal bulk and tone. Normal strength in all tested extremity muscles. No focal weakness Sensory.: intact to touch and pinprick and vibratory.  Coordination: Rapid alternating movements normal in all extremities. Finger-to-nose and heel-to-shin performed accurately bilaterally. Gait and Station: Mild atalgic gait. No assistive device.  Able to heel, toe and tandem walk without difficulty.  Reflexes: 2+ and symmetric except 1+ Achilles. Toes downgoing.     ASSESSMENT: History of probable complex partial seizure disorder with one generalized seizure  and then. Abnormal EEG with irritable focus in the left temporal lobe, has been stable on lamotrigine.     PLAN: Continue Lamictal at current dose  50 mg twice daily Call for any seizure activity Followup in one year  [name], GNP-BC APRN

## 2012-07-05 ENCOUNTER — Other Ambulatory Visit: Payer: Self-pay | Admitting: Family Medicine

## 2012-07-05 DIAGNOSIS — Z1231 Encounter for screening mammogram for malignant neoplasm of breast: Secondary | ICD-10-CM

## 2012-07-10 ENCOUNTER — Encounter: Payer: Self-pay | Admitting: Family Medicine

## 2012-07-10 ENCOUNTER — Ambulatory Visit (INDEPENDENT_AMBULATORY_CARE_PROVIDER_SITE_OTHER): Payer: Medicare HMO | Admitting: Family Medicine

## 2012-07-10 VITALS — BP 170/78 | HR 114 | Temp 98.2°F | Wt 196.0 lb

## 2012-07-10 DIAGNOSIS — I10 Essential (primary) hypertension: Secondary | ICD-10-CM

## 2012-07-10 NOTE — Progress Notes (Signed)
  Subjective:    Patient ID: Debra Carlson, female    DOB: 1935/06/29, 77 y.o.   MRN: [id number]  HPI Here for HTN. She took Losartan HCT and did well until a year or so ago. Her prescription ran out and she did not notify us. She was in the neurology office last month and her BP was high again. She feels fine in general.    Review of Systems  Constitutional: Negative.   Respiratory: Negative.   Cardiovascular: Negative.   Neurological: Negative.        Objective:   Physical Exam  Constitutional: She is oriented to person, place, and time. She appears well-developed and well-nourished. No distress.  Neck: No thyromegaly present.  Cardiovascular: Normal rate, regular rhythm, normal heart sounds and intact distal pulses.   Pulmonary/Chest: Effort normal and breath sounds normal.  Lymphadenopathy:    She has no cervical adenopathy.  Neurological: She is alert and oriented to person, place, and time.          Assessment & Plan:  Get back on Losartan HCT. Recheck in one month

## 2012-07-11 ENCOUNTER — Other Ambulatory Visit: Payer: Self-pay | Admitting: Family Medicine

## 2012-07-11 NOTE — Telephone Encounter (Signed)
Can we refill this? 

## 2012-07-31 ENCOUNTER — Ambulatory Visit (HOSPITAL_COMMUNITY)
Admission: RE | Admit: 2012-07-31 | Discharge: 2012-07-31 | Disposition: A | Discharge status: Home / Self Care | Payer: Medicare HMO | Source: Ambulatory Visit | Attending: Family Medicine | Admitting: Family Medicine

## 2012-07-31 DIAGNOSIS — Z1231 Encounter for screening mammogram for malignant neoplasm of breast: Secondary | ICD-10-CM | POA: Insufficient documentation

## 2012-09-02 ENCOUNTER — Other Ambulatory Visit: Payer: Self-pay | Admitting: Family Medicine

## 2012-09-04 ENCOUNTER — Encounter: Payer: Self-pay | Admitting: Family Medicine

## 2012-09-04 ENCOUNTER — Ambulatory Visit (INDEPENDENT_AMBULATORY_CARE_PROVIDER_SITE_OTHER): Payer: Medicare HMO | Admitting: Family Medicine

## 2012-09-04 VITALS — BP 144/74 | HR 88 | Temp 98.1°F | Wt 197.0 lb

## 2012-09-04 DIAGNOSIS — I10 Essential (primary) hypertension: Secondary | ICD-10-CM

## 2012-09-04 NOTE — Progress Notes (Signed)
  Subjective:    Patient ID: Debra Carlson, female    DOB: September 18, 1935, 77 y.o.   MRN: [id number]  HPI Here to follow up on HTN. She has ben back on Losartan HCT for 2 months and she is doing well. The BP is down and she feels good.    Review of Systems  Constitutional: Negative.   Respiratory: Negative.   Cardiovascular: Negative.        Objective:   Physical Exam  Constitutional: She appears well-developed and well-nourished.  Cardiovascular: Normal rate, regular rhythm, normal heart sounds and intact distal pulses.   Pulmonary/Chest: Effort normal and breath sounds normal.          Assessment & Plan:  Much improved. Stay on current meds and try to lose some weight.

## 2012-09-19 ENCOUNTER — Telehealth: Payer: Self-pay | Admitting: Family Medicine

## 2012-09-19 NOTE — Telephone Encounter (Signed)
I spoke with pt and she is going to schedule the office visit. 

## 2012-09-19 NOTE — Telephone Encounter (Signed)
Stop the Losartan HCT and make an OV this week so we can discuss this

## 2012-09-19 NOTE — Telephone Encounter (Signed)
Patient has been taking losartan-hydrochlorothiazide (HYZAAR) 100-25 MG per tablet. She stated he legs started to swell, so she stopped taking it. The swelling went down. She started taking it again, and the swelling is back. She wants to know if she can take something else. Please advise.

## 2012-09-25 ENCOUNTER — Ambulatory Visit (INDEPENDENT_AMBULATORY_CARE_PROVIDER_SITE_OTHER): Payer: Medicare HMO | Admitting: Family Medicine

## 2012-09-25 ENCOUNTER — Encounter: Payer: Self-pay | Admitting: Family Medicine

## 2012-09-25 VITALS — BP 142/88 | HR 113 | Temp 98.0°F | Wt 195.0 lb

## 2012-09-25 DIAGNOSIS — I10 Essential (primary) hypertension: Secondary | ICD-10-CM

## 2012-09-25 MED ORDER — HYDROCHLOROTHIAZIDE 25 MG PO TABS: 25.0000 mg | ORAL_TABLET | Freq: Every day | ORAL | Status: DC

## 2012-09-25 NOTE — Progress Notes (Signed)
  Subjective:    Patient ID: Debra Carlson, female    DOB: 04/03/1935, 77 y.o.   MRN: [id number]  HPI Here to follow up on HTN. She has been off Losartan HCT for several months, and she feels fine. However her BP will occasionally be borderline high. Some slight swelling in the feet.    Review of Systems  Constitutional: Negative.   Respiratory: Negative.   Cardiovascular: Positive for leg swelling. Negative for chest pain and palpitations.       Objective:   Physical Exam  Constitutional: She appears well-developed and well-nourished.  Cardiovascular: Normal rate, regular rhythm, normal heart sounds and intact distal pulses.   Pulmonary/Chest: Effort normal and breath sounds normal.          Assessment & Plan:  Her HTN has been slightly out of control. We will put her on HCTZ daily. Recheck in 3 months

## 2013-05-31 ENCOUNTER — Telehealth: Payer: Self-pay | Admitting: Nurse Practitioner

## 2013-05-31 ENCOUNTER — Ambulatory Visit: Payer: Medicare HMO | Admitting: Nurse Practitioner

## 2013-05-31 NOTE — Telephone Encounter (Signed)
No showed for scheduled appointment 

## 2013-06-05 ENCOUNTER — Ambulatory Visit (INDEPENDENT_AMBULATORY_CARE_PROVIDER_SITE_OTHER): Payer: Medicare HMO | Admitting: Nurse Practitioner

## 2013-06-05 ENCOUNTER — Encounter (INDEPENDENT_AMBULATORY_CARE_PROVIDER_SITE_OTHER): Payer: Self-pay

## 2013-06-05 ENCOUNTER — Encounter: Payer: Self-pay | Admitting: Nurse Practitioner

## 2013-06-05 VITALS — BP 153/80 | HR 96 | Ht 62.5 in | Wt 192.0 lb

## 2013-06-05 DIAGNOSIS — R569 Unspecified convulsions: Secondary | ICD-10-CM

## 2013-06-05 DIAGNOSIS — I10 Essential (primary) hypertension: Secondary | ICD-10-CM

## 2013-06-05 MED ORDER — LAMOTRIGINE 25 MG PO TABS: 50.0000 mg | ORAL_TABLET | Freq: Two times a day (BID) | ORAL | Status: DC

## 2013-06-05 NOTE — Patient Instructions (Signed)
Continue Lamictal at current dose will  Refill Call for  seizure activity Followup yearly

## 2013-06-05 NOTE — Progress Notes (Signed)
[facility]  PATIENT: Debra Carlson DOB: 1935/01/30   REASON FOR VISIT: Followup for seizure disorder   HISTORY OF PRESENT ILLNESS: [name], 78 year old female returns for followup. She has a history of seizure disorder and is currently taking Lamictal 50 mg twice daily. She denies side effects of the medication. Last seizure event occurred February 2007. EEG in the past was  Abnormal. No interval medical condition since last seen. She returns for reevaluation   HISTORY: She has a history of generalized seizure in February 2007. She had had previous episodes of what she called vertigo, all episodes had similar semiology she felt strange and dizziness afterwards with mild confusion these episodes can last 5-15 minutes and then she would come around and be slow to respond. She was placed on Dilantin but could not tolerate the medication. She is currently on Lamictal without side effects and further episodes. She continues to drive without difficulty. EEG in the past have been abnormal with an irritable focus in the left temple area. MRI of the brain was normal     REVIEW OF SYSTEMS: Full 14 system review of systems performed and notable only for those listed, all others are neg:  Constitutional: N/A  Cardiovascular: N/A  Ear/Nose/Throat: N/A  Skin: N/A  Eyes: N/A  Respiratory: N/A  Gastroitestinal: N/A  Hematology/Lymphatic: N/A  Endocrine: N/A Musculoskeletal: Joint pain  Allergy/Immunology: N/A  Neurological: N/A Psychiatric: N/A Sleep : NA   ALLERGIES: No Known Allergies  HOME MEDICATIONS: Outpatient Prescriptions Prior to Visit  Medication Sig Dispense Refill  . aspirin 81 MG tablet Take 81 mg by mouth daily.        . hydrochlorothiazide (HYDRODIURIL) 25 MG tablet Take 1 tablet (25 mg total) by mouth daily.  90 tablet  3  . lamoTRIgine (LAMICTAL) 25 MG tablet Take 2 tablets (50 mg total) by mouth 2 (two) times daily.  120 tablet  11  . meclizine  (ANTIVERT) 25 MG tablet Take 25 mg by mouth 2 (two) times daily as needed.       . Multiple Vitamin (MULTIVITAMIN) tablet Take 1 tablet by mouth daily.       No facility-administered medications prior to visit.    PAST MEDICAL HISTORY: Past Medical History  Diagnosis Date  . Hypertension   . Osteoarthritis   . Seizure disorder     single seizure in 2005, sees Dr. Kelli HopeMichael Reynolds  . Seizures     PAST SURGICAL HISTORY: Past Surgical History  Procedure Laterality Date  . Tonsillectomy      FAMILY HISTORY: History reviewed. No pertinent family history.  SOCIAL HISTORY: History   Social History  . Marital Status: Married    Spouse Name: N/A    Number of Children: 1  . Years of Education: 12+   Occupational History  . Retired     Social History Main Topics  . Smoking status: Never Smoker   . Smokeless tobacco: Never Used  . Alcohol Use: No  . Drug Use: No  . Sexual Activity: Not on file   Other Topics Concern  . Not on file   Social History Narrative   Married    Never Smoked   Alcohol use- no   Drug use- no           PHYSICAL EXAM  Filed Vitals:   06/05/13 0938  BP: 153/80  Pulse: 96  Height: 5' 2.5" (1.588 m)  Weight: 192 lb (87.091 kg)   Body mass index  is 34.54 kg/(m^2).  Generalized: Well developed, in no acute distress   Neurological examination   Mentation: Alert oriented to time, place, history taking. Follows all commands speech and language fluent  Cranial nerve II-XII: Pupils were equal round reactive to light extraocular movements were full, visual field were full on confrontational test. Facial sensation and strength were normal. hearing was intact to finger rubbing bilaterally. Uvula tongue midline. head turning and shoulder shrug were normal and symmetric.Tongue protrusion into cheek strength was normal. Motor: normal bulk and tone, full strength in the BUE, BLE, fine finger movements normal, no pronator drift. No focal  weakness Sensory: normal and symmetric to light touch, pinprick, and  vibration  Coordination: finger-nose-finger, heel-to-shin bilaterally, no dysmetria Reflexes: Brachioradialis 2/2, biceps 2/2, triceps 2/2, patellar 2/2, Achilles 1/1, plantar responses were flexor bilaterally. Gait and Station: Rising up from seated position without assistance, mild atalgic gait,  able to perform tiptoe, and heel walking without difficulty. Tandem gait is steady. No assistive device  DIAGNOSTIC DATA (LABS, IMAGING, TESTING) -   ASSESSMENT AND PLAN  78 y.o. year old female  has a past medical history of complex partial seizure disorder; here to followup. Last seizure event 2007. Abnormal EEG in the past with irritable focus in the left temporal lobe.   Continue Lamictal at current dose will  Refill Call for  seizure activity Followup yearly [name], [facility], [facility], APRN  [facility] [address] [place], [place] [address] [phone]

## 2013-06-25 ENCOUNTER — Other Ambulatory Visit: Payer: Self-pay | Admitting: Family Medicine

## 2013-06-25 DIAGNOSIS — Z1231 Encounter for screening mammogram for malignant neoplasm of breast: Secondary | ICD-10-CM

## 2013-08-01 ENCOUNTER — Ambulatory Visit (HOSPITAL_COMMUNITY)
Admission: RE | Admit: 2013-08-01 | Discharge: 2013-08-01 | Disposition: A | Discharge status: Home / Self Care | Payer: Medicare HMO | Source: Ambulatory Visit | Attending: Family Medicine | Admitting: Family Medicine

## 2013-08-01 DIAGNOSIS — Z1231 Encounter for screening mammogram for malignant neoplasm of breast: Secondary | ICD-10-CM | POA: Insufficient documentation

## 2013-11-02 ENCOUNTER — Ambulatory Visit (INDEPENDENT_AMBULATORY_CARE_PROVIDER_SITE_OTHER): Payer: Medicare HMO | Admitting: Family Medicine

## 2013-11-02 ENCOUNTER — Encounter: Payer: Self-pay | Admitting: Family Medicine

## 2013-11-02 VITALS — BP 152/84 | HR 90 | Temp 97.7°F | Ht 62.5 in | Wt 192.3 lb

## 2013-11-02 DIAGNOSIS — L539 Erythematous condition, unspecified: Secondary | ICD-10-CM

## 2013-11-02 NOTE — Progress Notes (Signed)
No chief complaint on file.   HPI:  Acute visit for:  1) Finger Issues: -started yesterday -mild erythema on L index finger -not painful or itchy  -PCP, Dr. Clent RidgesFry, not available today  ROS: See pertinent positives and negatives per HPI.  Past Medical History  Diagnosis Date  . Hypertension   . Osteoarthritis   . Seizure disorder     single seizure in 2005, sees Dr. Kelli HopeMichael Reynolds  . Seizures     Past Surgical History  Procedure Laterality Date  . Tonsillectomy      No family history on file.  History   Social History  . Marital Status: Married    Spouse Name: N/A    Number of Children: 1  . Years of Education: 12+   Occupational History  . Retired     Social History Main Topics  . Smoking status: Never Smoker   . Smokeless tobacco: Never Used  . Alcohol Use: No  . Drug Use: No  . Sexual Activity: None   Other Topics Concern  . None   Social History Narrative   Married    Never Smoked   Alcohol use- no   Drug use- no          Current outpatient prescriptions:aspirin 81 MG tablet, Take 81 mg by mouth daily.  , Disp: , Rfl: ;  lamoTRIgine (LAMICTAL) 25 MG tablet, Take 2 tablets (50 mg total) by mouth 2 (two) times daily., Disp: 120 tablet, Rfl: 11;  meclizine (ANTIVERT) 25 MG tablet, Take 25 mg by mouth 2 (two) times daily as needed. , Disp: , Rfl: ;  Multiple Vitamin (MULTIVITAMIN) tablet, Take 1 tablet by mouth daily., Disp: , Rfl:   EXAM:  Filed Vitals:   11/02/13 1601  BP: 152/84  Pulse: 90  Temp: 97.7 F (36.5 C)    Body mass index is 34.59 kg/(m^2).  GENERAL: vitals reviewed and listed above, alert, oriented, appears well hydrated and in no acute distress  HEENT: atraumatic, conjunttiva clear, no obvious abnormalities on inspection of external nose and ears  NECK: no obvious masses on inspection  LUNGS: clear to auscultation bilaterally, no wheezes, rales or rhonchi, good air movement  SKIN: mild erytherthem of distal lateral  index finger, no warmth or induration, no joint TTP, no TTP, no skin lesions, nail appears normal  CV: HRRR, no peripheral edema  MS: moves all extremities without noticeable abnormality  PSYCH: pleasant and cooperative, no obvious depression or anxiety  ASSESSMENT AND PLAN:  Discussed the following assessment and plan:  Erythema of skin  -not sure of etiology - does not appear to be infected -discussed options and given minimal area involved, no pain, no other symptoms opted for observation and prompt follow up if worsening in urgent care this weekend if any pain, swelling, other symptoms -follow up with PCP next week if needed -Patient advised to return or notify a doctor immediately if symptoms worsen or persist or new concerns arise.  There are no Patient Instructions on file for this visit.   Kriste BasqueKIM, Zadkiel Dragan R.

## 2013-11-02 NOTE — Progress Notes (Signed)
Pre visit review using our clinic review tool, if applicable. No additional management support is needed unless otherwise documented below in the visit note. 

## 2013-11-02 NOTE — Patient Instructions (Signed)
-  seek care immediately if worsening or other symptoms  -follow up next week or as needed

## 2013-11-28 ENCOUNTER — Encounter: Payer: Self-pay | Admitting: Neurology

## 2013-12-03 ENCOUNTER — Encounter: Payer: Self-pay | Admitting: Nurse Practitioner

## 2013-12-04 ENCOUNTER — Encounter: Payer: Self-pay | Admitting: Neurology

## 2014-05-13 DIAGNOSIS — H2513 Age-related nuclear cataract, bilateral: Secondary | ICD-10-CM | POA: Diagnosis not present

## 2014-05-13 DIAGNOSIS — H524 Presbyopia: Secondary | ICD-10-CM | POA: Diagnosis not present

## 2014-05-29 ENCOUNTER — Ambulatory Visit (INDEPENDENT_AMBULATORY_CARE_PROVIDER_SITE_OTHER): Payer: Commercial Managed Care - HMO | Admitting: Family Medicine

## 2014-05-29 ENCOUNTER — Encounter: Payer: Self-pay | Admitting: Family Medicine

## 2014-05-29 VITALS — BP 181/98 | HR 96 | Temp 98.1°F | Ht 62.5 in | Wt 194.0 lb

## 2014-05-29 DIAGNOSIS — Z0000 Encounter for general adult medical examination without abnormal findings: Secondary | ICD-10-CM

## 2014-05-29 DIAGNOSIS — I10 Essential (primary) hypertension: Secondary | ICD-10-CM

## 2014-05-29 LAB — BASIC METABOLIC PANEL
BUN: 21 mg/dL (ref 6–23)
CHLORIDE: 103 meq/L (ref 96–112)
CO2: 28 mEq/L (ref 19–32)
Calcium: 9.9 mg/dL (ref 8.4–10.5)
Creatinine, Ser: 0.81 mg/dL (ref 0.40–1.20)
GFR: 88.16 mL/min (ref >60.00)
Glucose, Bld: 116 mg/dL — ABNORMAL HIGH (ref 70–99)
POTASSIUM: 3.7 meq/L (ref 3.5–5.1)
Sodium: 137 mEq/L (ref 135–145)

## 2014-05-29 LAB — LIPID PANEL
Cholesterol: 151 mg/dL (ref 0–200)
HDL: 44.3 mg/dL (ref >39.00)
LDL CALC: 97 mg/dL (ref 0–99)
NONHDL: 106.7
Total CHOL/HDL Ratio: 3
Triglycerides: 50 mg/dL (ref 0.0–149.0)
VLDL: 10 mg/dL (ref 0.0–40.0)

## 2014-05-29 LAB — CBC WITH DIFFERENTIAL/PLATELET
BASOS ABS: 0 10*3/uL (ref 0.0–0.1)
Basophils Relative: 0.7 % (ref 0.0–3.0)
EOS ABS: 0.2 10*3/uL (ref 0.0–0.7)
Eosinophils Relative: 2.9 % (ref 0.0–5.0)
HCT: 42.4 % (ref 36.0–46.0)
HEMOGLOBIN: 14.1 g/dL (ref 12.0–15.0)
LYMPHS PCT: 32.2 % (ref 12.0–46.0)
Lymphs Abs: 1.9 10*3/uL (ref 0.7–4.0)
MCHC: 33.3 g/dL (ref 30.0–36.0)
MCV: 89.3 fl (ref 78.0–100.0)
MONOS PCT: 10.2 % (ref 3.0–12.0)
Monocytes Absolute: 0.6 10*3/uL (ref 0.1–1.0)
NEUTROS PCT: 54 % (ref 43.0–77.0)
Neutro Abs: 3.2 10*3/uL (ref 1.4–7.7)
PLATELETS: 221 10*3/uL (ref 150.0–400.0)
RBC: 4.75 Mil/uL (ref 3.87–5.11)
RDW: 12.8 % (ref 11.5–15.5)
WBC: 6 10*3/uL (ref 4.0–10.5)

## 2014-05-29 LAB — POCT URINALYSIS DIPSTICK
Bilirubin, UA: NEGATIVE
GLUCOSE UA: NEGATIVE
Ketones, UA: NEGATIVE
Leukocytes, UA: NEGATIVE
NITRITE UA: NEGATIVE
RBC UA: NEGATIVE
SPEC GRAV UA: 1.025
Urobilinogen, UA: 0.2
pH, UA: 5

## 2014-05-29 LAB — HEPATIC FUNCTION PANEL
ALBUMIN: 4.1 g/dL (ref 3.5–5.2)
ALT: 11 U/L (ref 0–35)
AST: 15 U/L (ref 0–37)
Alkaline Phosphatase: 84 U/L (ref 39–117)
Bilirubin, Direct: 0.1 mg/dL (ref 0.0–0.3)
TOTAL PROTEIN: 7.3 g/dL (ref 6.0–8.3)
Total Bilirubin: 0.6 mg/dL (ref 0.2–1.2)

## 2014-05-29 LAB — TSH: TSH: 3.54 u[IU]/mL (ref 0.35–4.50)

## 2014-05-29 NOTE — Progress Notes (Signed)
   Subjective:    Patient ID: Debra StampsGladys M Carlson, female    DOB: Jan 18, 1935, [age] y.o.   MRN: [id number]  HPI 79 yr old female for a well exam. She is doing well. She has no concerns. She has not had a seizure in many years but she still takes Lamictal. She sees Neurology yearly. She gets a mammogram yearly. She has never had a colonoscopy but she notes her mother died of colon cancer in her 3060's.    Review of Systems  Constitutional: Negative.   HENT: Negative.   Eyes: Negative.   Respiratory: Negative.   Cardiovascular: Negative.   Gastrointestinal: Negative.   Genitourinary: Negative for dysuria, urgency, frequency, hematuria, flank pain, decreased urine volume, enuresis, difficulty urinating, pelvic pain and dyspareunia.  Musculoskeletal: Negative.   Skin: Negative.   Neurological: Negative.   Psychiatric/Behavioral: Negative.        Objective:   Physical Exam  Constitutional: She is oriented to person, place, and time. She appears well-developed and well-nourished. No distress.  HENT:  Head: Normocephalic and atraumatic.  Right Ear: External ear normal.  Left Ear: External ear normal.  Nose: Nose normal.  Mouth/Throat: Oropharynx is clear and moist. No oropharyngeal exudate.  Eyes: Conjunctivae and EOM are normal. Pupils are equal, round, and reactive to light. No scleral icterus.  Neck: Normal range of motion. Neck supple. No JVD present. No thyromegaly present.  Cardiovascular: Normal rate, regular rhythm, normal heart sounds and intact distal pulses.  Exam reveals no gallop and no friction rub.   No murmur heard. EKG normal   Pulmonary/Chest: Effort normal and breath sounds normal. No respiratory distress. She has no wheezes. She has no rales. She exhibits no tenderness.  Abdominal: Soft. Bowel sounds are normal. She exhibits no distension and no mass. There is no tenderness. There is no rebound and no guarding.  Musculoskeletal: Normal range of motion. She exhibits no edema  or tenderness.  Lymphadenopathy:    She has no cervical adenopathy.  Neurological: She is alert and oriented to person, place, and time. She has normal reflexes. No cranial nerve deficit. She exhibits normal muscle tone. Coordination normal.  Skin: Skin is warm and dry. No rash noted. No erythema.  Psychiatric: She has a normal mood and affect. Her behavior is normal. Judgment and thought content normal.          Assessment & Plan:  Well exam. We discussed diet and the fact that she needw to lose some weight. Her BP at home is steady around 140/80, and we will continue to monitor this

## 2014-05-29 NOTE — Progress Notes (Signed)
Pre visit review using our clinic review tool, if applicable. No additional management support is needed unless otherwise documented below in the visit note. 

## 2014-06-06 ENCOUNTER — Ambulatory Visit: Payer: Medicare HMO | Admitting: Nurse Practitioner

## 2014-06-06 ENCOUNTER — Ambulatory Visit (INDEPENDENT_AMBULATORY_CARE_PROVIDER_SITE_OTHER): Payer: Commercial Managed Care - HMO | Admitting: Nurse Practitioner

## 2014-06-06 ENCOUNTER — Encounter: Payer: Self-pay | Admitting: Nurse Practitioner

## 2014-06-06 VITALS — BP 144/78 | HR 93 | Ht 62.0 in | Wt 192.8 lb

## 2014-06-06 DIAGNOSIS — R5601 Complex febrile convulsions: Secondary | ICD-10-CM | POA: Diagnosis not present

## 2014-06-06 MED ORDER — LAMOTRIGINE 25 MG PO TABS: 50.0000 mg | ORAL_TABLET | Freq: Two times a day (BID) | ORAL | Status: DC

## 2014-06-06 NOTE — Patient Instructions (Signed)
Continue Lamictal at current dose will refill for one year Reviewed recent CBC CMP within normal limits Call for any seizure activity Follow-up yearly and when necessary

## 2014-06-06 NOTE — Progress Notes (Signed)
I have reviewed and agreed above plan. 

## 2014-06-06 NOTE — Progress Notes (Signed)
[facility]  PATIENT: Debra Carlson DOB: 1935/02/10   REASON FOR VISIT: Follow-up for seizure disorder  HISTORY FROM: Patient    HISTORY OF PRESENT ILLNESS:Debra Carlson, 79 year old female returns for followup. She has a history of seizure disorder and is currently taking Lamictal 50 mg twice daily. She denies side effects of the medication. Last seizure event occurred February 2007. EEG in the past was abnormal. No interval medical condition since last seen. She just had her yearly physical and  all of her labs return normal She returns for reevaluation.   HISTORY: She has a history of generalized seizure in February 2007. She had had previous episodes of what she called vertigo, all episodes had similar semiology she felt strange and dizziness afterwards with mild confusion these episodes can last 5-15 minutes and then she would come around and be slow to respond. She was placed on Dilantin but could not tolerate the medication. She is currently on Lamictal without side effects and further episodes. She continues to drive without difficulty. EEG in the past have been abnormal with an irritable focus in the left temple area. MRI of the brain was normal    REVIEW OF SYSTEMS: Full 14 system review of systems performed and notable only for those listed, all others are neg:  Constitutional: neg  Cardiovascular: neg Ear/Nose/Throat: neg  Skin: neg Eyes: neg Respiratory: neg Gastroitestinal: neg  Hematology/Lymphatic: neg  Endocrine: neg Musculoskeletal: Knee pain  Allergy/Immunology: neg Neurological: neg Psychiatric: neg Sleep : neg   ALLERGIES: No Known Allergies  HOME MEDICATIONS: Outpatient Prescriptions Prior to Visit  Medication Sig Dispense Refill  . aspirin 81 MG tablet Take 81 mg by mouth daily.      Marland Kitchen. lamoTRIgine (LAMICTAL) 25 MG tablet Take 2 tablets (50 mg total) by mouth 2 (two) times daily. 120 tablet 11  . meclizine (ANTIVERT) 25 MG tablet  Take 25 mg by mouth 2 (two) times daily as needed.     . Multiple Vitamin (MULTIVITAMIN) tablet Take 1 tablet by mouth daily.     No facility-administered medications prior to visit.    PAST MEDICAL HISTORY: Past Medical History  Diagnosis Date  . Hypertension   . Osteoarthritis   . Seizure disorder     single seizure in 2005, sees Dr. Kelli HopeMichael Reynolds  . Seizures     PAST SURGICAL HISTORY: Past Surgical History  Procedure Laterality Date  . Tonsillectomy      FAMILY HISTORY: History reviewed. No pertinent family history.  SOCIAL HISTORY: History   Social History  . Marital Status: Married    Spouse Name: N/A  . Number of Children: 1  . Years of Education: 12+   Occupational History  . Retired     Social History Main Topics  . Smoking status: Never Smoker   . Smokeless tobacco: Never Used  . Alcohol Use: No  . Drug Use: No  . Sexual Activity: Not on file   Other Topics Concern  . Not on file   Social History Narrative   Married    Never Smoked   Alcohol use- no   Drug use- no           PHYSICAL EXAM  Filed Vitals:   06/06/14 0910  BP: 144/78  Pulse: 93  Height: 5\' 2"  (1.575 m)  Weight: 192 lb 12.8 oz (87.454 kg)   Body mass index is 35.25 kg/(m^2). Generalized: Well developed, in no acute distress   Neurological examination   Mentation:  Alert oriented to time, place, history taking. Follows all commands speech and language fluent  Cranial nerve II-XII: Pupils were equal round reactive to light extraocular movements were full, visual field were full on confrontational test. Facial sensation and strength were normal. hearing was intact to finger rubbing bilaterally. Uvula tongue midline. head turning and shoulder shrug were normal and symmetric.Tongue protrusion into cheek strength was normal. Motor: normal bulk and tone, full strength in the BUE, BLE, fine finger movements normal, no pronator drift. No focal weakness Sensory: normal and  symmetric to light touch, pinprick, and vibration  Coordination: finger-nose-finger, heel-to-shin bilaterally, no dysmetria Reflexes: Brachioradialis 2/2, biceps 2/2, triceps 2/2, patellar 2/2, Achilles 1/1, plantar responses were flexor bilaterally. Gait and Station: Rising up from seated position without assistance, mild atalgic gait, able to perform tiptoe, and heel walking without difficulty. Tandem gait is unsteady. No assistive device   DIAGNOSTIC DATA (LABS, IMAGING, TESTING) - I reviewed patient records, labs, notes, testing and imaging myself where available.  Lab Results  Component Value Date   WBC 6.0 05/29/2014   HGB 14.1 05/29/2014   HCT 42.4 05/29/2014   MCV 89.3 05/29/2014   PLT 221.0 05/29/2014      Component Value Date/Time   NA 137 05/29/2014 1001   K 3.7 05/29/2014 1001   CL 103 05/29/2014 1001   CO2 28 05/29/2014 1001   GLUCOSE 116* 05/29/2014 1001   GLUCOSE 110* 12/10/2005 1144   BUN 21 05/29/2014 1001   CREATININE 0.81 05/29/2014 1001   CALCIUM 9.9 05/29/2014 1001   PROT 7.3 05/29/2014 1001   ALBUMIN 4.1 05/29/2014 1001   AST 15 05/29/2014 1001   ALT 11 05/29/2014 1001   ALKPHOS 84 05/29/2014 1001   BILITOT 0.6 05/29/2014 1001   GFRNONAA 62.82 02/25/2009 0000   GFRAA 63 09/09/2006 1147   Lab Results  Component Value Date   CHOL 151 05/29/2014   HDL 44.30 05/29/2014   LDLCALC 97 05/29/2014   TRIG 50.0 05/29/2014   CHOLHDL 3 05/29/2014    No results found for: VITAMINB12 Lab Results  Component Value Date   TSH 3.54 05/29/2014      ASSESSMENT AND PLAN  79 y.o. year old female  has a past medical history of Hypertension; Osteoarthritis; complex partial seizure disorder here to follow-up. Last seizure in February 2007, she is currently well-controlled on Lamictal. Abnormal EEG in the past with irritable focus in the left temporal lobe.  Continue Lamictal at current dose will refill for one year Reviewed recent CBC CMP within normal  limits Call for any seizure activity Follow-up yearly and when necessary [name], [facility], [facility], APRN  [facility] [address] [place], [place] [address] [phone]

## 2014-06-25 ENCOUNTER — Encounter: Payer: Self-pay | Admitting: Gastroenterology

## 2014-07-08 ENCOUNTER — Other Ambulatory Visit: Payer: Self-pay | Admitting: Family Medicine

## 2014-07-08 DIAGNOSIS — Z1231 Encounter for screening mammogram for malignant neoplasm of breast: Secondary | ICD-10-CM

## 2014-07-12 ENCOUNTER — Emergency Department (HOSPITAL_COMMUNITY)
Admission: EM | Admit: 2014-07-12 | Discharge: 2014-07-13 | Disposition: A | Discharge status: Home / Self Care | Payer: Commercial Managed Care - HMO | Attending: Emergency Medicine | Admitting: Emergency Medicine

## 2014-07-12 ENCOUNTER — Encounter (HOSPITAL_COMMUNITY): Payer: Self-pay | Admitting: *Deleted

## 2014-07-12 ENCOUNTER — Emergency Department (HOSPITAL_COMMUNITY): Payer: Commercial Managed Care - HMO

## 2014-07-12 DIAGNOSIS — I10 Essential (primary) hypertension: Secondary | ICD-10-CM | POA: Diagnosis not present

## 2014-07-12 DIAGNOSIS — Z79899 Other long term (current) drug therapy: Secondary | ICD-10-CM | POA: Diagnosis not present

## 2014-07-12 DIAGNOSIS — Z7982 Long term (current) use of aspirin: Secondary | ICD-10-CM | POA: Diagnosis not present

## 2014-07-12 DIAGNOSIS — M1990 Unspecified osteoarthritis, unspecified site: Secondary | ICD-10-CM | POA: Diagnosis not present

## 2014-07-12 DIAGNOSIS — G40909 Epilepsy, unspecified, not intractable, without status epilepticus: Secondary | ICD-10-CM | POA: Insufficient documentation

## 2014-07-12 DIAGNOSIS — M7989 Other specified soft tissue disorders: Secondary | ICD-10-CM | POA: Diagnosis not present

## 2014-07-12 DIAGNOSIS — R600 Localized edema: Secondary | ICD-10-CM | POA: Diagnosis not present

## 2014-07-12 LAB — URINALYSIS, ROUTINE W REFLEX MICROSCOPIC
BILIRUBIN URINE: NEGATIVE
GLUCOSE, UA: NEGATIVE mg/dL
HGB URINE DIPSTICK: NEGATIVE
KETONES UR: NEGATIVE mg/dL
LEUKOCYTES UA: NEGATIVE
NITRITE: NEGATIVE
PH: 5.5 (ref 5.0–8.0)
PROTEIN: NEGATIVE mg/dL
Specific Gravity, Urine: 1.008 (ref 1.005–1.030)
Urobilinogen, UA: 0.2 mg/dL (ref 0.0–1.0)

## 2014-07-12 LAB — CBC WITH DIFFERENTIAL/PLATELET
Basophils Absolute: 0 10*3/uL (ref 0.0–0.1)
Basophils Relative: 0 % (ref 0–1)
EOS PCT: 3 % (ref 0–5)
Eosinophils Absolute: 0.2 10*3/uL (ref 0.0–0.7)
HCT: 41.4 % (ref 36.0–46.0)
HEMOGLOBIN: 13.4 g/dL (ref 12.0–15.0)
LYMPHS PCT: 30 % (ref 12–46)
Lymphs Abs: 1.9 10*3/uL (ref 0.7–4.0)
MCH: 29.5 pg (ref 26.0–34.0)
MCHC: 32.4 g/dL (ref 30.0–36.0)
MCV: 91.2 fL (ref 78.0–100.0)
Monocytes Absolute: 0.7 10*3/uL (ref 0.1–1.0)
Monocytes Relative: 11 % (ref 3–12)
NEUTROS ABS: 3.5 10*3/uL (ref 1.7–7.7)
NEUTROS PCT: 56 % (ref 43–77)
Platelets: 210 10*3/uL (ref 150–400)
RBC: 4.54 MIL/uL (ref 3.87–5.11)
RDW: 12.8 % (ref 11.5–15.5)
WBC: 6.2 10*3/uL (ref 4.0–10.5)

## 2014-07-12 LAB — COMPREHENSIVE METABOLIC PANEL
ALBUMIN: 3.6 g/dL (ref 3.5–5.0)
ALT: 14 U/L (ref 14–54)
AST: 21 U/L (ref 15–41)
Alkaline Phosphatase: 76 U/L (ref 38–126)
Anion gap: 8 (ref 5–15)
BILIRUBIN TOTAL: 0.6 mg/dL (ref 0.3–1.2)
BUN: 15 mg/dL (ref 6–20)
CALCIUM: 9.2 mg/dL (ref 8.9–10.3)
CHLORIDE: 106 mmol/L (ref 101–111)
CO2: 27 mmol/L (ref 22–32)
Creatinine, Ser: 0.82 mg/dL (ref 0.44–1.00)
GFR calc Af Amer: >60 mL/min (ref >60)
GFR calc non Af Amer: >60 mL/min (ref >60)
Glucose, Bld: 155 mg/dL — ABNORMAL HIGH (ref 65–99)
Potassium: 3.9 mmol/L (ref 3.5–5.1)
Sodium: 141 mmol/L (ref 135–145)
Total Protein: 6.5 g/dL (ref 6.5–8.1)

## 2014-07-12 LAB — BRAIN NATRIURETIC PEPTIDE: B NATRIURETIC PEPTIDE 5: 32.3 pg/mL (ref 0.0–100.0)

## 2014-07-12 LAB — TROPONIN I

## 2014-07-12 NOTE — ED Provider Notes (Signed)
CSN: [id number]     Arrival date & time 07/12/14  2012 History   First MD Initiated Contact with Patient 07/12/14 2121     Chief Complaint  Patient presents with  . Leg Swelling   Patient is a 79 y.o. female presenting with general illness. The history is provided by the patient.  Illness Location:  Legs bilaterally Quality:  Swelling Severity:  Moderate Onset quality:  Gradual Duration:  5 days Timing:  Constant Progression:  Worsening Chronicity:  New Context:  Bilateral LE swelling for 5 days.  No symptoms otherwise.  Relieved by:  Leg elevation.  Worsened by:  Walking.  Associated symptoms: no abdominal pain, no chest pain, no congestion, no cough, no diarrhea, no ear pain, no fatigue, no fever, no headaches, no loss of consciousness, no myalgias, no nausea, no rash, no rhinorrhea, no shortness of breath, no sore throat, no vomiting and no wheezing     Past Medical History  Diagnosis Date  . Hypertension   . Osteoarthritis   . Seizure disorder     single seizure in 2005, sees Dr. Kelli HopeMichael Reynolds  . Seizures    Past Surgical History  Procedure Laterality Date  . Tonsillectomy     No family history on file. History  Substance Use Topics  . Smoking status: Never Smoker   . Smokeless tobacco: Never Used  . Alcohol Use: No   OB History    No data available     Review of Systems  Constitutional: Negative for fever and fatigue.  HENT: Negative for congestion, ear pain, rhinorrhea and sore throat.   Respiratory: Negative for cough, shortness of breath and wheezing.   Cardiovascular: Positive for leg swelling. Negative for chest pain and palpitations.  Gastrointestinal: Negative for nausea, vomiting, abdominal pain and diarrhea.  Genitourinary: Negative for dysuria and flank pain.  Musculoskeletal: Negative for myalgias.  Skin: Negative for rash.  Neurological: Negative for dizziness, loss of consciousness, syncope, numbness and headaches.  Hematological: Negative  for adenopathy.  Psychiatric/Behavioral: Negative.     Allergies  Review of patient's allergies indicates no known allergies.  Home Medications   Prior to Admission medications   Medication Sig Start Date End Date Taking? Authorizing Provider  aspirin 81 MG tablet Take 81 mg by mouth daily.      Historical Provider, MD  lamoTRIgine (LAMICTAL) 25 MG tablet Take 2 tablets (50 mg total) by mouth 2 (two) times daily. 06/06/14   Nilda RiggsNancy Carolyn Martin, NP  meclizine (ANTIVERT) 25 MG tablet Take 25 mg by mouth 2 (two) times daily as needed.     Historical Provider, MD  Multiple Vitamin (MULTIVITAMIN) tablet Take 1 tablet by mouth daily.    Historical Provider, MD   BP 169/74 mmHg  Pulse 89  Temp(Src) 97.6 F (36.4 C)  SpO2 98% Physical Exam  Constitutional: She is oriented to person, place, and time. She appears well-developed and well-nourished. No distress.  HENT:  Head: Normocephalic and atraumatic.  Eyes: Conjunctivae are normal. Pupils are equal, round, and reactive to light.  Neck: Normal range of motion. Neck supple.  Cardiovascular: Normal rate, regular rhythm and normal heart sounds.  Exam reveals no gallop.   No murmur heard. +2 LE edema bilaterally.   Pulmonary/Chest: Effort normal and breath sounds normal. No respiratory distress. She has no wheezes. She has no rales.  Abdominal: Soft. Bowel sounds are normal. She exhibits no distension. There is no tenderness. There is no rebound.  Musculoskeletal: Normal range of motion.  Neurological: She is alert and oriented to person, place, and time. She has normal reflexes. No cranial nerve deficit. Coordination normal.  Skin: Skin is warm.    ED Course  Procedures (including critical care time) Labs Review Labs Reviewed  CBC WITH DIFFERENTIAL/PLATELET  COMPREHENSIVE METABOLIC PANEL  TROPONIN I  URINALYSIS, ROUTINE W REFLEX MICROSCOPIC (NOT AT [facility])    Imaging Review Dg Chest 2 View  07/12/2014   CLINICAL DATA:  Lower  extremity edema for 5 days.  Nonsmoker.  EXAM: CHEST  2 VIEW  COMPARISON:  None.  FINDINGS: Lateral view degraded by patient arm position. Moderate thoracic spondylosis. Midline trachea. Normal heart size and mediastinal contours. No pleural effusion or pneumothorax. No congestive failure. Clear lungs.  IMPRESSION: No acute cardiopulmonary disease.   Electronically Signed   By: Jeronimo Greaves M.D.   On: 07/12/2014 20:57     EKG Interpretation   Date/Time:  Friday July 12 2014 20:34:15 EDT Ventricular Rate:  92 PR Interval:  140 QRS Duration: 76 QT Interval:  362 QTC Calculation: 447 R Axis:   14 Text Interpretation:  Normal sinus rhythm Cannot rule out Anterior infarct  , age undetermined Abnormal ECG Sinus rhythm T wave abnormality Artifact  No significant change since last tracing Abnormal ekg Confirmed by  Gerhard Munch  MD [id number]) on 07/12/2014 10:11:50 PM      MDM   Final diagnoses:  None    Pt is a 79 yo female with a hx of seizure disorder on Lamictal and vertigo presenting for LE swelling for the last 5 days.  Denies any CP, SOB, PND (although always sleeps sitting up), chills, n/v.    Ddx new onset CHF, DVT, lymphadema, renal failure, liver disease.    On evaluation pt HDS in NAD.  Pt endorses no pain or symptoms other then leg swelling.  +2 pitting edema bilaterally of LEs.  Doubt DVT due to bilateral with no discoloration or tenderness.   CBC with no leukocytosis or anemia.  EKG with no acute ischemic changes, arrhythmia, or hypertrophic changes.  Troponin negative.  UA with no protein, ketones or signs of infections.  CXR with no effusions or cardiomegaly and no crackles on exam.  CMP with normal LFTs and albumin.  Cr and BUN with no acute elevations.  Doubt acute hepatic failure.  BNP wnl.    Advised pt possible etiology is venous insufficiency.    If performed, labs, EKGs, and imaging were reviewed/interpreted by myself and my attending and incorporated into medical  decision making.  Discussed pertinent finding with patient or caregiver prior to discharge with no further questions.  Immediate return precautions given and pt or caregiver reports understanding.  Pt care supervised by my attending Dr. Trecia Rogers, MD PGY-2  Emergency Medicine      Tery Sanfilippo, MD 07/13/14 1478  Gerhard Munch, MD 07/16/14 [phone]

## 2014-07-12 NOTE — Discharge Instructions (Signed)

## 2014-07-12 NOTE — ED Notes (Signed)
The pt is c/o swollen feet and ankles for one week.  No pain.  Knees swollen alsoi

## 2014-07-12 NOTE — ED Notes (Signed)
MD at bedside. 

## 2014-07-22 ENCOUNTER — Ambulatory Visit: Payer: Commercial Managed Care - HMO | Admitting: Family Medicine

## 2014-07-29 ENCOUNTER — Ambulatory Visit (INDEPENDENT_AMBULATORY_CARE_PROVIDER_SITE_OTHER): Payer: Commercial Managed Care - HMO | Admitting: Family Medicine

## 2014-07-29 ENCOUNTER — Encounter: Payer: Self-pay | Admitting: Family Medicine

## 2014-07-29 VITALS — BP 143/85 | HR 103 | Temp 98.4°F | Ht 62.0 in | Wt 193.0 lb

## 2014-07-29 DIAGNOSIS — R600 Localized edema: Secondary | ICD-10-CM

## 2014-07-29 MED ORDER — FUROSEMIDE 20 MG PO TABS: 20.0000 mg | ORAL_TABLET | Freq: Every day | ORAL | Status: DC

## 2014-07-29 NOTE — Progress Notes (Signed)
Pre visit review using our clinic review tool, if applicable. No additional management support is needed unless otherwise documented below in the visit note. 

## 2014-07-29 NOTE — Progress Notes (Signed)
   Subjective:    Patient ID: Debra Carlson, female    DOB: 09-Jun-1935, 79 y.o.   MRN: [id number]  HPI Here for several weeks of swelling in both lower legs. There is no discomfort. No SOB. No recent medication changes. She was in the ER on 07-12-14 and her workup was normal including a BMET and a BNP.    Review of Systems  Constitutional: Negative.   Respiratory: Negative.   Cardiovascular: Positive for leg swelling. Negative for chest pain and palpitations.  Gastrointestinal: Negative.   Endocrine: Negative.   Genitourinary: Negative.   Neurological: Negative.        Objective:   Physical Exam  Constitutional: She is oriented to person, place, and time. She appears well-developed and well-nourished. No distress.  Neck: No thyromegaly present.  Cardiovascular: Normal rate, regular rhythm, normal heart sounds and intact distal pulses.   Pulmonary/Chest: Effort normal and breath sounds normal. No respiratory distress. She has no wheezes. She has no rales.  Abdominal: Soft. Bowel sounds are normal. She exhibits no distension and no mass. There is no tenderness. There is no rebound and no guarding.  Lymphadenopathy:    She has no cervical adenopathy.  Neurological: She is alert and oriented to person, place, and time.          Assessment & Plan:  LE edema due to venous insufficiency. Start on Lasix 20 mg daily. Keep the legs elevated. Recheck prn

## 2014-08-05 ENCOUNTER — Ambulatory Visit (HOSPITAL_COMMUNITY)
Admission: RE | Admit: 2014-08-05 | Discharge: 2014-08-05 | Disposition: A | Discharge status: Home / Self Care | Payer: Commercial Managed Care - HMO | Source: Ambulatory Visit | Attending: Family Medicine | Admitting: Family Medicine

## 2014-08-05 ENCOUNTER — Other Ambulatory Visit: Payer: Self-pay | Admitting: Family Medicine

## 2014-08-05 DIAGNOSIS — Z1231 Encounter for screening mammogram for malignant neoplasm of breast: Secondary | ICD-10-CM | POA: Insufficient documentation

## 2014-08-14 ENCOUNTER — Ambulatory Visit (AMBULATORY_SURGERY_CENTER): Payer: Self-pay

## 2014-08-14 ENCOUNTER — Telehealth: Payer: Self-pay

## 2014-08-14 VITALS — Ht 62.0 in | Wt 196.0 lb

## 2014-08-14 DIAGNOSIS — Z1211 Encounter for screening for malignant neoplasm of colon: Secondary | ICD-10-CM

## 2014-08-14 MED ORDER — NA SULFATE-K SULFATE-MG SULF 17.5-3.13-1.6 GM/177ML PO SOLN: 1.0000 | Freq: Once | ORAL | Status: DC

## 2014-08-14 NOTE — Progress Notes (Signed)
No hx of anesthesia complications No egg or soy allergies Pt doesn't take diet mediations or diet drugs Not on home 02 Sent phone note to Dr. Russella Dar to okay screening colonoscopy due to pt's age (73).

## 2014-08-14 NOTE — Telephone Encounter (Addendum)
Pt is 79 yrs old and is scheduled for her 1st screening colonoscopy. PV completed. Pt has a family history of colon cancer (mother). She denies any gi symptoms or concerns.Is patient okay for direct colon or does she need an OV? Thanks Toniann Fail PV

## 2014-08-14 NOTE — Telephone Encounter (Signed)
79 year old meeting pre visit criteria for direct colonoscopy with a correct indication is ok to schedule directly. Pts over 80 need office visit.Marland Kitchen

## 2014-08-14 NOTE — Telephone Encounter (Signed)
Noted  

## 2014-08-26 ENCOUNTER — Telehealth: Payer: Self-pay | Admitting: Gastroenterology

## 2014-08-26 ENCOUNTER — Telehealth: Payer: Self-pay | Admitting: General Practice

## 2014-08-26 NOTE — Telephone Encounter (Signed)
error 

## 2014-08-26 NOTE — Telephone Encounter (Signed)
Yes charge 

## 2014-08-27 ENCOUNTER — Encounter: Payer: Commercial Managed Care - HMO | Admitting: Gastroenterology

## 2014-09-02 ENCOUNTER — Telehealth: Payer: Self-pay | Admitting: Family Medicine

## 2014-09-02 NOTE — Telephone Encounter (Signed)
Patient Name: Debra Carlson  DOB: 08-Jan-1936    Initial Comment caller states she is taking 20 mg of furosemide - is having a problem with her stomach pain and is having back pain   Nurse Assessment  Nurse: Scarlette Ar, RN, Herbert Seta Date/Time (Eastern Time): 09/02/2014 1:08:37 PM  Confirm and document reason for call. If symptomatic, describe symptoms. ---caller states she is taking 20 mg of furosemide and after she takes it she has a problem with her stomach pain and is having lower back pain. She has noticed it off and on for the last 2 weeks. she takes it for foot swelling, and the swelling is down in one foot but the other foot is swollen more.  Has the patient traveled out of the country within the last 30 days? ---Not Applicable  Does the patient require triage? ---Yes  Related visit to physician within the last 2 weeks? ---No  Does the PT have any chronic conditions? (i.e. diabetes, asthma, etc.) ---Yes  List chronic conditions. ---see MR     Guidelines    Guideline Title Affirmed Question Affirmed Notes  Back Pain [1] MODERATE back pain (e.g., interferes with normal activities) AND [2] present > 3 days    Final Disposition User   See Physician within 24 Hours Standifer, RN, Research scientist (physical sciences)    Comments  Appt made for tomorrow at 11 am with Dr. Clent Ridges   Referrals  REFERRED TO PCP OFFICE   Disagree/Comply: Comply

## 2014-09-03 ENCOUNTER — Ambulatory Visit (INDEPENDENT_AMBULATORY_CARE_PROVIDER_SITE_OTHER): Payer: Commercial Managed Care - HMO | Admitting: Family Medicine

## 2014-09-03 ENCOUNTER — Encounter: Payer: Self-pay | Admitting: Family Medicine

## 2014-09-03 VITALS — BP 148/73 | HR 94 | Temp 98.8°F | Ht 62.0 in | Wt 196.0 lb

## 2014-09-03 DIAGNOSIS — R600 Localized edema: Secondary | ICD-10-CM | POA: Insufficient documentation

## 2014-09-03 DIAGNOSIS — I10 Essential (primary) hypertension: Secondary | ICD-10-CM

## 2014-09-03 MED ORDER — FUROSEMIDE 20 MG PO TABS: 20.0000 mg | ORAL_TABLET | Freq: Every day | ORAL | Status: DC | PRN

## 2014-09-03 NOTE — Progress Notes (Signed)
   Subjective:    Patient ID: Debra Carlson, female    DOB: 1935-06-21, 79 y.o.   MRN: [id number]  HPI Here to follow up on leg edema. We saw her last month and started her on Lasix 20 mg daily. This has worked well for her. It makes her urinate a lot and the swelling as reduced. However in the past few days she has developed some mild aching in the lower back which starts about one hour after taking the Lasix and lasts several ours before it stops.    Review of Systems  Constitutional: Negative.   Respiratory: Negative.   Cardiovascular: Positive for leg swelling. Negative for chest pain and palpitations.  Gastrointestinal: Negative.        Objective:   Physical Exam  Constitutional: She appears well-developed and well-nourished.  Cardiovascular: Normal rate, regular rhythm, normal heart sounds and intact distal pulses.   Pulmonary/Chest: Effort normal and breath sounds normal.  Musculoskeletal:  2+ edema in both lower legs.           Assessment & Plan:  The Lasix has helped the edema but it seems to be too strong for her to use every day. We will change the directions to use only as needed. Drink plenty of water. Recheck prn .

## 2014-09-03 NOTE — Progress Notes (Signed)
Pre visit review using our clinic review tool, if applicable. No additional management support is needed unless otherwise documented below in the visit note. 

## 2015-05-30 ENCOUNTER — Encounter: Payer: Self-pay | Admitting: Nurse Practitioner

## 2015-05-30 ENCOUNTER — Ambulatory Visit: Payer: Commercial Managed Care - HMO | Admitting: Nurse Practitioner

## 2015-06-06 ENCOUNTER — Ambulatory Visit: Payer: Commercial Managed Care - HMO | Admitting: Nurse Practitioner

## 2015-06-16 ENCOUNTER — Ambulatory Visit (INDEPENDENT_AMBULATORY_CARE_PROVIDER_SITE_OTHER): Payer: Commercial Managed Care - HMO | Admitting: Nurse Practitioner

## 2015-06-16 ENCOUNTER — Encounter: Payer: Self-pay | Admitting: Nurse Practitioner

## 2015-06-16 VITALS — BP 161/88 | HR 94 | Resp 16 | Ht 62.0 in | Wt 190.0 lb

## 2015-06-16 DIAGNOSIS — Z5181 Encounter for therapeutic drug level monitoring: Secondary | ICD-10-CM | POA: Diagnosis not present

## 2015-06-16 DIAGNOSIS — R5601 Complex febrile convulsions: Secondary | ICD-10-CM

## 2015-06-16 NOTE — Progress Notes (Signed)
[facility]  PATIENT: Debra Carlson DOB: 10-Feb-1935   REASON FOR VISIT: Follow-up for complex partial seizures HISTORY FROM: Patient    HISTORY OF PRESENT ILLNESS:[name], 79 year old female returns for followup. She has a history of seizure disorder and is currently taking Lamictal 50 mg twice daily. She denies side effects of the medication. Last seizure event occurred February 2007. EEG in the past was abnormal. No interval medical condition since last seen. Labs are followed by primary care She returns for reevaluation.   HISTORY: She has a history of generalized seizure in February 2007. She had had previous episodes of what she called vertigo, all episodes had similar semiology she felt strange and dizziness afterwards with mild confusion these episodes can last 5-15 minutes and then she would come around and be slow to respond. She was placed on Dilantin but could not tolerate the medication. She is currently on Lamictal without side effects and further episodes. She continues to drive without difficulty. EEG in the past have been abnormal with an irritable focus in the left temple area. MRI of the brain was normal    REVIEW OF SYSTEMS: Full 14 system review of systems performed and notable only for those listed, all others are neg:  Constitutional: neg  Cardiovascular: neg Ear/Nose/Throat: neg  Skin: neg Eyes: neg Respiratory: neg Gastroitestinal: neg  Hematology/Lymphatic: neg  Endocrine: neg Musculoskeletal:neg Allergy/Immunology: neg Neurological: neg Psychiatric: neg Sleep : neg   ALLERGIES: No Known Allergies  HOME MEDICATIONS: Outpatient Prescriptions Prior to Visit  Medication Sig Dispense Refill  . aspirin 81 MG tablet Take 81 mg by mouth daily.      . furosemide (LASIX) 20 MG tablet Take 1 tablet (20 mg total) by mouth daily as needed for edema. 30 tablet 5  . lamoTRIgine (LAMICTAL) 25 MG tablet Take 2 tablets (50 mg total) by  mouth 2 (two) times daily. 120 tablet 11  . meclizine (ANTIVERT) 25 MG tablet Take 25 mg by mouth 2 (two) times daily as needed for dizziness.     . Multiple Vitamin (MULTIVITAMIN) tablet Take 1 tablet by mouth daily.    Marland Kitchen trolamine salicylate (ASPERCREME) 10 % cream Apply 1 application topically as needed (for knees).    Marland Kitchen b complex vitamins tablet Take 1 tablet by mouth as needed (for energy).    . Cholecalciferol (VITAMIN D PO) Take 1 tablet by mouth daily.    . Na Sulfate-K Sulfate-Mg Sulf (SUPREP BOWEL PREP) SOLN Take 1 kit by mouth once. Name brand only, no substitutions, take as directed (Patient not taking: Reported on 09/03/2014) 354 mL 0  . Naproxen Sodium (ALEVE PO) Take by mouth as needed. As needed for knee pain     No facility-administered medications prior to visit.    PAST MEDICAL HISTORY: Past Medical History  Diagnosis Date  . Hypertension   . Osteoarthritis   . Seizure disorder [facility])     single seizure in 2005, sees Dr. Elvia Collum  . Cataract   . Seizures ([place])     last seizure in 2011    PAST SURGICAL HISTORY: Past Surgical History  Procedure Laterality Date  . Tonsillectomy    . Cesarean section      FAMILY HISTORY: Family History  Problem Relation Age of Onset  . Colon cancer Mother     SOCIAL HISTORY: Social History   Social History  . Marital Status: Married    Spouse Name: N/A  . Number of Children: 1  .  Years of Education: 12+   Occupational History  . Retired     Social History Main Topics  . Smoking status: Never Smoker   . Smokeless tobacco: Never Used  . Alcohol Use: No  . Drug Use: No  . Sexual Activity: Not on file   Other Topics Concern  . Not on file   Social History Narrative   Married    Never Smoked   Alcohol use- no   Drug use- no           PHYSICAL EXAM  Filed Vitals:   06/16/15 1523  BP: 161/88  Pulse: 94  Resp: 16  Height: _0  (1.575 m)  Weight: 190 lb (86.183 kg)   Body mass index is 34.74  kg/(m^2). Generalized: Well developed, in no acute distress   Neurological examination   Mentation: Alert oriented to time, place, history taking. Follows all commands speech and language fluent  Cranial nerve II-XII: Pupils were equal round reactive to light extraocular movements were full, visual field were full on confrontational test. Facial sensation and strength were normal. hearing was intact to finger rubbing bilaterally. Uvula tongue midline. head turning and shoulder shrug were normal and symmetric.Tongue protrusion into cheek strength was normal. Motor: normal bulk and tone, full strength in the BUE, BLE, fine finger movements normal, no pronator drift. No focal weakness Sensory: normal and symmetric to light touch, pinprick, and vibration  Coordination: finger-nose-finger, heel-to-shin bilaterally, no dysmetria Reflexes: Brachioradialis 2/2, biceps 2/2, triceps 2/2, patellar 2/2, Achilles 1/1, plantar responses were flexor bilaterally. Gait and Station: Rising up from seated position without assistance, mild atalgic gait, able to perform tiptoe, and heel walking without difficulty. Tandem gait is unsteady. No assistive device  DIAGNOSTIC DATA (LABS, IMAGING, TESTING) -  ASSESSMENT AND PLAN 80y.o. year old female has a past medical history of Hypertension; Osteoarthritis; complex partial seizure disorder here to follow-up. Last seizure in February 2007, she is currently well-controlled on Lamictal. Abnormal EEG in the past with irritable focus in the left temporal lobe.  Continue Lamictal at current dose will refill for one year patient would like to decrease dose Will check Lamictal level then refill Call for any seizure activity Follow-up yearly and when necessary Dennie Bible, [facility], [facility], [facility] [address] [place], [place] [id number] [phone]

## 2015-06-16 NOTE — Patient Instructions (Signed)
Continue Lamictal at current dose will refill for one year Will check level  Call for any seizure activity Follow-up yearly and when necessary

## 2015-06-17 ENCOUNTER — Other Ambulatory Visit: Payer: Self-pay | Admitting: Nurse Practitioner

## 2015-06-17 LAB — LAMOTRIGINE LEVEL: Lamotrigine Lvl: 2.9 ug/mL (ref 2.0–20.0)

## 2015-06-17 MED ORDER — LAMOTRIGINE 25 MG PO TABS: 50.0000 mg | ORAL_TABLET | Freq: Two times a day (BID) | ORAL | Status: DC

## 2015-06-17 NOTE — Progress Notes (Signed)
I have reviewed and agreed above plan. 

## 2015-06-23 ENCOUNTER — Telehealth: Payer: Self-pay | Admitting: Nurse Practitioner

## 2015-06-23 NOTE — Progress Notes (Signed)
Quick Note:  Called and spoke to patient relayed Lamictal level was good. Relayed to patient Eber JonesCarolyn did not feel comfortable with decreasing her dose. Patient understood. ______

## 2015-06-23 NOTE — Telephone Encounter (Signed)
-----   Message from Nilda RiggsNancy Carolyn Martin, NP sent at 06/17/2015  5:15 PM EDT ----- Please let patient know Lamictal level is ok but I would not feel comfortable decreasing dose.

## 2015-06-23 NOTE — Telephone Encounter (Signed)
Called and spoke to patient relayed Lamictal level was good relayed to patient Debra JonesCarolyn was not comfortable with decreasing dose. Patient understood.

## 2015-07-07 ENCOUNTER — Other Ambulatory Visit: Payer: Self-pay | Admitting: Family Medicine

## 2015-07-07 DIAGNOSIS — Z1231 Encounter for screening mammogram for malignant neoplasm of breast: Secondary | ICD-10-CM

## 2015-08-06 ENCOUNTER — Ambulatory Visit
Admission: RE | Admit: 2015-08-06 | Discharge: 2015-08-06 | Disposition: A | Discharge status: Home / Self Care | Payer: Commercial Managed Care - HMO | Source: Ambulatory Visit | Attending: Family Medicine | Admitting: Family Medicine

## 2015-08-06 DIAGNOSIS — Z1231 Encounter for screening mammogram for malignant neoplasm of breast: Secondary | ICD-10-CM

## 2015-12-25 ENCOUNTER — Ambulatory Visit (INDEPENDENT_AMBULATORY_CARE_PROVIDER_SITE_OTHER): Payer: Commercial Managed Care - HMO | Admitting: Family Medicine

## 2015-12-25 VITALS — BP 158/84 | HR 87 | Temp 98.5°F | Resp 18 | Ht 61.0 in | Wt 188.2 lb

## 2015-12-25 DIAGNOSIS — R3915 Urgency of urination: Secondary | ICD-10-CM

## 2015-12-25 MED ORDER — CEPHALEXIN 500 MG PO CAPS: 500.0000 mg | ORAL_CAPSULE | Freq: Two times a day (BID) | ORAL | 0 refills | Status: DC

## 2015-12-25 NOTE — Patient Instructions (Signed)
     IF you received an x-ray today, you will receive an invoice from [facility]. Please contact [facility] at [phone] with questions or concerns regarding your invoice.   IF you received labwork today, you will receive an invoice from [facility]. Please contact [facility] at [phone] with questions or concerns regarding your invoice.   Our billing staff will not be able to assist you with questions regarding bills from these companies.  You will be contacted with the lab results as soon as they are available. The fastest way to get your results is to activate your My Chart account. Instructions are located on the last page of this paperwork. If you have not heard from us regarding the results in 2 weeks, please contact this office.     

## 2015-12-25 NOTE — Progress Notes (Signed)
Debra StampsGladys M Carlson is a 80 y.o. female who [facility] with complaints concerning for UTI:  1.  Concern for UTI: Debra StampsGladys M Carlson is a 80 y.o. female who complains of urinary frequency, urgency and dysuria x 2 days.  Also has noticed her urine is pink after she wipes. She denies any flank or back pain, fever, chills, vomiting, or abnormal vaginal discharge/itching/bleeding.  Does endorse some suprapubic pressure.  Some nocturia 2-3x nightly, which is also new for her.  No history of incontinence.    Family history: No history of recurrent UTIs and family.  I note in her problem list that she has a problem of recurrent UTIs. However here we do not have any microbiology. She does not have frequent UA's in the records. She herself denies any recurrent UTIs and can only remember one in the past.  The following portions of the patient's history were reviewed and updated as appropriate: allergies, current medications, past medical history, family and social history, and problem list.  Patient is a nonsmoker.    Past Medical History:  Diagnosis Date  . Cataract   . Hypertension   . Osteoarthritis   . Seizure disorder [facility])    single seizure in 2005, sees Dr. Kelli HopeMichael Reynolds  . Seizures ([facility])    last seizure in 2011   Past Surgical History:  Procedure Laterality Date  . CESAREAN SECTION    . TONSILLECTOMY      Medications reviewed. Current Outpatient Prescriptions  Medication Sig Dispense Refill  . aspirin 81 MG tablet Take 81 mg by mouth daily.      Marland Kitchen. lamoTRIgine (LAMICTAL) 25 MG tablet Take 2 tablets (50 mg total) by mouth 2 (two) times daily. 120 tablet 11  . meclizine (ANTIVERT) 25 MG tablet Take 25 mg by mouth 2 (two) times daily as needed for dizziness.     . Multiple Vitamin (MULTIVITAMIN) tablet Take 1 tablet by mouth daily.    Marland Kitchen. trolamine salicylate (ASPERCREME) 10 % cream Apply 1 application topically as needed (for knees).    . furosemide (LASIX) 20 MG tablet Take 1  tablet (20 mg total) by mouth daily as needed for edema. (Patient not taking: Reported on 12/25/2015) 30 tablet 5   No current facility-administered medications for this visit.     ROS as above otherwise neg.       Objective:   Physical Exam BP (!) 158/84 (BP Location: Right Arm, Patient Position: Sitting, Cuff Size: Large)   Pulse 87   Temp 98.5 F (36.9 C) (Oral)   Resp 18   Ht 5\' 1"  (1.549 m)   Wt 188 lb 3.2 oz (85.4 kg)   SpO2 96%   BMI 35.56 kg/m  Gen:  Alert, cooperative patient who appears stated age in no acute distress.  Vital signs reviewed. Mouth: MMM Cardiac:  Regular rate and rhythm without murmur auscultated.  Good S1/S2. Pulm:  Clear to auscultation bilaterally with good air movement.  No wheezes or rales noted.   Abdomen:  Soft/ND/NT.  No CVA tenderness bilaterally   No results found for this or any previous visit (from the past 72 hour(s)).   Imp/Plan: 1.  UTI: Treating based on symptoms.  Patient has ongoing issues with hesitancy.  She was here about an hour, drinking water, and unable to void.  She's had somewhere to be and couldn't wait any longer.   Treatment with keflex x 7 days. Unfortunately will not have a culture.   Call  or return to clinic prn if these symptoms worsen or fail to improve as anticipated, or if she begins to have any back pain, fevers, or chills.

## 2016-06-15 ENCOUNTER — Ambulatory Visit (INDEPENDENT_AMBULATORY_CARE_PROVIDER_SITE_OTHER): Payer: Medicare HMO | Admitting: Neurology

## 2016-06-15 ENCOUNTER — Encounter: Payer: Self-pay | Admitting: Neurology

## 2016-06-15 DIAGNOSIS — G40209 Localization-related (focal) (partial) symptomatic epilepsy and epileptic syndromes with complex partial seizures, not intractable, without status epilepticus: Secondary | ICD-10-CM | POA: Diagnosis not present

## 2016-06-15 MED ORDER — LAMOTRIGINE 25 MG PO TABS: 50.0000 mg | ORAL_TABLET | Freq: Two times a day (BID) | ORAL | 11 refills | Status: DC

## 2016-06-15 MED ORDER — LAMOTRIGINE 25 MG PO TABS: 50.0000 mg | ORAL_TABLET | Freq: Two times a day (BID) | ORAL | 4 refills | Status: DC

## 2016-06-15 NOTE — Progress Notes (Signed)
[facility]  PATIENT: Debra Carlson DOB: [birth year]  HISTORY OF PRESENT ILLNESS:Debra Carlson, 81 year old female returns for followup. She has a history of seizure disorder and is currently taking Lamictal 50 mg twice daily. She denies side effects of the medication. Last seizure event occurred February 2007. EEG in the past was abnormal. No interval medical condition since last seen. Labs are followed by primary care She returns for reevaluation.   HISTORY: She has a history of generalized seizure in February 2007. She had had previous episodes of what she called vertigo, all episodes had similar semiology she felt strange and dizziness afterwards with mild confusion these episodes can last 5-15 minutes and then she would come around and be slow to respond. She was placed on Dilantin but could not tolerate the medication. She is currently on Lamictal without side effects and further episodes. She continues to drive without difficulty. EEG in the past have been abnormal with an irritable focus in the left temple area. MRI of the brain was normal   UPDATE June 15 2016: He is doing well there was no recurrent seizure.   REVIEW OF SYSTEMS: Full 14 system review of systems performed and notable only for those listed, all others are neg:  As above.   ALLERGIES: No Known Allergies  HOME MEDICATIONS: Outpatient Medications Prior to Visit  Medication Sig Dispense Refill  . aspirin 81 MG tablet Take 81 mg by mouth daily.      . furosemide (LASIX) 20 MG tablet Take 1 tablet (20 mg total) by mouth daily as needed for edema. 30 tablet 5  . lamoTRIgine (LAMICTAL) 25 MG tablet Take 2 tablets (50 mg total) by mouth 2 (two) times daily. 120 tablet 11  . meclizine (ANTIVERT) 25 MG tablet Take 25 mg by mouth 2 (two) times daily as needed for dizziness.     . Multiple Vitamin (MULTIVITAMIN) tablet Take 1 tablet by mouth daily.    Marland Kitchen trolamine salicylate (ASPERCREME) 10 % cream Apply 1  application topically as needed (for knees).    . cephALEXin (KEFLEX) 500 MG capsule Take 1 capsule (500 mg total) by mouth 2 (two) times daily. X 7 days.  For UTI 14 capsule 0   No facility-administered medications prior to visit.     PAST MEDICAL HISTORY: Past Medical History:  Diagnosis Date  . Cataract   . Hypertension   . Osteoarthritis   . Seizure disorder [facility])    single seizure in 2005, sees Dr. Kelli Hope  . Seizures (HCC)    last seizure in 2011    PAST SURGICAL HISTORY: Past Surgical History:  Procedure Laterality Date  . CESAREAN SECTION    . TONSILLECTOMY      FAMILY HISTORY: Family History  Problem Relation Age of Onset  . Colon cancer Mother     SOCIAL HISTORY: Social History   Social History  . Marital status: Married    Spouse name: N/A  . Number of children: 1  . Years of education: 12+   Occupational History  . Retired     Social History Main Topics  . Smoking status: Never Smoker  . Smokeless tobacco: Never Used  . Alcohol use No  . Drug use: No  . Sexual activity: Not on file   Other Topics Concern  . Not on file   Social History Narrative   Married    Never Smoked   Alcohol use- no   Drug use- no  PHYSICAL EXAM  Vitals:   06/15/16 1448  BP: (!) 162/73  Pulse: 96  Weight: 188 lb 4 oz (85.4 kg)  Height: 5\' 1"  (1.549 m)   Body mass index is 35.57 kg/m.  PHYSICAL EXAMNIATION:  Gen: NAD, conversant, well nourised, obese, well groomed                     Cardiovascular: Regular rate rhythm, no peripheral edema, warm, nontender. Eyes: Conjunctivae clear without exudates or hemorrhage Neck: Supple, no carotid bruits. Pulmonary: Clear to auscultation bilaterally   NEUROLOGICAL EXAM:  MENTAL STATUS: Speech:    Speech is normal; fluent and spontaneous with normal comprehension.  Cognition:     Orientation to time, place and person     Normal recent and remote memory     Normal Attention span and  concentration     Normal Language, naming, repeating,spontaneous speech     Fund of knowledge   CRANIAL NERVES: CN II: Visual fields are full to confrontation. Fundoscopic exam is normal with sharp discs and no vascular changes. Pupils are round equal and briskly reactive to light. CN III, IV, VI: extraocular movement are normal. No ptosis. CN V: Facial sensation is intact to pinprick in all 3 divisions bilaterally. Corneal responses are intact.  CN VII: Face is symmetric with normal eye closure and smile. CN VIII: Hearing is normal to rubbing fingers CN IX, X: Palate elevates symmetrically. Phonation is normal. CN XI: Head turning and shoulder shrug are intact CN XII: Tongue is midline with normal movements and no atrophy.  MOTOR: There is no pronator drift of out-stretched arms. Muscle bulk and tone are normal. Muscle strength is normal.  REFLEXES: Reflexes are 2+ and symmetric at the biceps, triceps, knees, and ankles. Plantar responses are flexor.  SENSORY: Intact to light touch, pinprick, positional and vibratory sensation are intact in fingers and toes.  COORDINATION: Rapid alternating movements and fine finger movements are intact. There is no dysmetria on finger-to-nose and heel-knee-shin.    GAIT/STANCE: Posture is normal. Gait is steady with normal steps, base, arm swing, and turning. Heel and toe walking are normal. Tandem gait is normal.  Romberg is absent.    DIAGNOSTIC DATA (LABS, IMAGING, TESTING) -  ASSESSMENT AND PLAN 81y.o. year old female Complex partial seizure  Keep current dose of lamotrigine 50 mg twice a day  Debra FeinsteinYijun Zyshawn Carlson, M.D. Ph.D.  [facility] [address] [place], [place] [address] Phone: [phone] Fax:      [phone]

## 2016-08-03 ENCOUNTER — Other Ambulatory Visit: Payer: Self-pay | Admitting: Family Medicine

## 2016-08-03 DIAGNOSIS — Z1231 Encounter for screening mammogram for malignant neoplasm of breast: Secondary | ICD-10-CM

## 2016-08-11 ENCOUNTER — Ambulatory Visit: Payer: Commercial Managed Care - HMO

## 2016-08-19 ENCOUNTER — Ambulatory Visit
Admission: RE | Admit: 2016-08-19 | Discharge: 2016-08-19 | Disposition: A | Discharge status: Home / Self Care | Payer: Commercial Managed Care - HMO | Source: Ambulatory Visit | Attending: Family Medicine | Admitting: Family Medicine

## 2016-08-19 DIAGNOSIS — Z1231 Encounter for screening mammogram for malignant neoplasm of breast: Secondary | ICD-10-CM

## 2017-04-19 ENCOUNTER — Ambulatory Visit: Payer: Self-pay | Admitting: *Deleted

## 2017-04-19 NOTE — Telephone Encounter (Signed)
Called in c/o being dizzy on and off for 2 weeks.   She also thought her BP was high.   It was 152/88 with her home machine which has an arm cuff.   She mentioned she was also has epilepsy.     She thinks this may be vertigo.   The spinning and dizziness is like when she had it before.    She has to hold onto things for balance on and off but not all the time.     I made her an appt with Dr. Clent RidgesFry for 04/20/17 at 10:00.  I instructed her to call us back if she got worse or go to the ED.   She verbalized understanding.   Reason for Disposition . [1] MODERATE dizziness (e.g., vertigo; feels very unsteady, interferes with normal activities) AND [2] has NOT been evaluated by physician for this  Answer Assessment - Initial Assessment Questions 1. DESCRIPTION: "Describe your dizziness."     When I get up to walk things are spinning.   Started 2 weeks ago on and off.  2. VERTIGO: "Do you feel like either you or the room is spinning or tilting?"      Feels like things are going around.   I've had vertigo in the past. 3. LIGHTHEADED: "Do you feel lightheaded?" (e.g., somewhat faint, woozy, weak upon standing)     When first get up I feel light headed but usually when I try to walk. 4. SEVERITY: "How bad is it?"  "Can you walk?"   - MILD - Feels unsteady but walking normally.   - MODERATE - Feels very unsteady when walking, but not falling; interferes with normal activities (e.g., school, work) .   - SEVERE - Unable to walk without falling (requires assistance).     Sometimes I have to hold on to things for my balance.    5. ONSET:  "When did the dizziness begin?"     2 weeks on and off. 6. AGGRAVATING FACTORS: "Does anything make it worse?" (e.g., standing, change in head position)     When I get up to walk around. 7. CAUSE: "What do you think is causing the dizziness?"     Vertigo.   I've had it before. 8. RECURRENT SYMPTOM: "Have you had dizziness before?" If so, ask: "When was the last time?"  "What happened that time?"     Yes.  I don't remember.   It's been a while. 9. OTHER SYMPTOMS: "Do you have any other symptoms?" (e.g., headache, weakness, numbness, vomiting, earache)     I have a BP machine here at the house.   152/88 is my BP.   I don't know how it runs.   It's a machine with the cuff.   I also have epilepsy.   10. PREGNANCY: "Is there any chance you are pregnant?" "When was your last menstrual period?"       Not asked due to age  Protocols used: DIZZINESS - VERTIGO-A-AH

## 2017-04-20 ENCOUNTER — Encounter: Payer: Self-pay | Admitting: Family Medicine

## 2017-04-20 ENCOUNTER — Ambulatory Visit (INDEPENDENT_AMBULATORY_CARE_PROVIDER_SITE_OTHER): Payer: Medicare HMO | Admitting: Family Medicine

## 2017-04-20 VITALS — BP 158/84 | HR 73 | Temp 97.8°F | Ht 61.0 in | Wt 191.0 lb

## 2017-04-20 DIAGNOSIS — R42 Dizziness and giddiness: Secondary | ICD-10-CM | POA: Diagnosis not present

## 2017-04-20 MED ORDER — MECLIZINE HCL 25 MG PO TABS: 25.0000 mg | ORAL_TABLET | ORAL | 2 refills | Status: AC | PRN

## 2017-04-20 NOTE — Progress Notes (Signed)
   Subjective:    Patient ID: Debra Carlson, female    DOB: December 27, 1935, 82 y.o.   MRN: [id number]  HPI Here for 2 weeks of intermittent dizziness which she thinks is vertigo. Today is the first time we have seen her for about 2 years. She has had this in the past. She is fine when she is still but sudden head movements make it seem like the room is spinning. No headache. No other neurologic deficits. She still drives herself.    Review of Systems  Constitutional: Negative.   Respiratory: Negative.   Cardiovascular: Negative.   Neurological: Positive for dizziness. Negative for syncope, facial asymmetry, speech difficulty, weakness, light-headedness, numbness and headaches.       Objective:   Physical Exam  Constitutional: She is oriented to person, place, and time. She appears well-developed and well-nourished.  Walks with a cane   HENT:  Right Ear: External ear normal.  Left Ear: External ear normal.  Nose: Nose normal.  Mouth/Throat: Oropharynx is clear and moist.  Eyes: Pupils are equal, round, and reactive to light. Conjunctivae and EOM are normal.  Cardiovascular: Normal rate, regular rhythm, normal heart sounds and intact distal pulses.  Pulmonary/Chest: Effort normal and breath sounds normal. No respiratory distress. She has no wheezes. She has no rales.  Neurological: She is alert and oriented to person, place, and time. No cranial nerve deficit. She exhibits normal muscle tone. Coordination normal.          Assessment & Plan:  Vertigo. She will take a Claritin daily for a month or two until the pollen outside settles down. Add meclizine prn.  Gershon CraneStephen Fry, MD

## 2017-05-13 ENCOUNTER — Encounter: Payer: Medicare HMO | Admitting: Family Medicine

## 2017-05-20 ENCOUNTER — Ambulatory Visit (INDEPENDENT_AMBULATORY_CARE_PROVIDER_SITE_OTHER): Payer: Medicare HMO | Admitting: Family Medicine

## 2017-05-20 ENCOUNTER — Encounter: Payer: Self-pay | Admitting: Family Medicine

## 2017-05-20 VITALS — BP 140/80 | HR 97 | Temp 98.2°F | Ht 61.5 in | Wt 194.1 lb

## 2017-05-20 DIAGNOSIS — M159 Polyosteoarthritis, unspecified: Secondary | ICD-10-CM

## 2017-05-20 DIAGNOSIS — I10 Essential (primary) hypertension: Secondary | ICD-10-CM | POA: Diagnosis not present

## 2017-05-20 DIAGNOSIS — M150 Primary generalized (osteo)arthritis: Secondary | ICD-10-CM | POA: Diagnosis not present

## 2017-05-20 DIAGNOSIS — Z23 Encounter for immunization: Secondary | ICD-10-CM

## 2017-05-20 DIAGNOSIS — R600 Localized edema: Secondary | ICD-10-CM

## 2017-05-20 LAB — POC URINALSYSI DIPSTICK (AUTOMATED)
BILIRUBIN UA: NEGATIVE
Blood, UA: NEGATIVE
GLUCOSE UA: NEGATIVE
KETONES UA: NEGATIVE
Leukocytes, UA: NEGATIVE
Nitrite, UA: NEGATIVE
Spec Grav, UA: >=1.03 — AB (ref 1.010–1.025)
Urobilinogen, UA: 0.2 E.U./dL
pH, UA: 6 (ref 5.0–8.0)

## 2017-05-20 LAB — CBC WITH DIFFERENTIAL/PLATELET
Basophils Absolute: 0 10*3/uL (ref 0.0–0.1)
Basophils Relative: 0.5 % (ref 0.0–3.0)
EOS PCT: 3.1 % (ref 0.0–5.0)
Eosinophils Absolute: 0.2 10*3/uL (ref 0.0–0.7)
HCT: 41.7 % (ref 36.0–46.0)
Hemoglobin: 13.9 g/dL (ref 12.0–15.0)
LYMPHS ABS: 2.1 10*3/uL (ref 0.7–4.0)
Lymphocytes Relative: 37.4 % (ref 12.0–46.0)
MCHC: 33.4 g/dL (ref 30.0–36.0)
MCV: 90.9 fl (ref 78.0–100.0)
MONOS PCT: 11.7 % (ref 3.0–12.0)
Monocytes Absolute: 0.6 10*3/uL (ref 0.1–1.0)
Neutro Abs: 2.6 10*3/uL (ref 1.4–7.7)
Neutrophils Relative %: 47.3 % (ref 43.0–77.0)
Platelets: 219 10*3/uL (ref 150.0–400.0)
RBC: 4.58 Mil/uL (ref 3.87–5.11)
RDW: 12.7 % (ref 11.5–15.5)
WBC: 5.5 10*3/uL (ref 4.0–10.5)

## 2017-05-20 LAB — HEPATIC FUNCTION PANEL
ALK PHOS: 85 U/L (ref 39–117)
ALT: 8 U/L (ref 0–35)
AST: 10 U/L (ref 0–37)
Albumin: 4 g/dL (ref 3.5–5.2)
BILIRUBIN DIRECT: 0.2 mg/dL (ref 0.0–0.3)
Total Bilirubin: 0.7 mg/dL (ref 0.2–1.2)
Total Protein: 6.8 g/dL (ref 6.0–8.3)

## 2017-05-20 LAB — BASIC METABOLIC PANEL
BUN: 17 mg/dL (ref 6–23)
CO2: 30 mEq/L (ref 19–32)
Calcium: 9.3 mg/dL (ref 8.4–10.5)
Chloride: 104 mEq/L (ref 96–112)
Creatinine, Ser: 0.81 mg/dL (ref 0.40–1.20)
GFR: 87.49 mL/min (ref >60.00)
Glucose, Bld: 119 mg/dL — ABNORMAL HIGH (ref 70–99)
POTASSIUM: 4.3 meq/L (ref 3.5–5.1)
Sodium: 141 mEq/L (ref 135–145)

## 2017-05-20 LAB — LIPID PANEL
CHOL/HDL RATIO: 3
Cholesterol: 155 mg/dL (ref 0–200)
HDL: 45.9 mg/dL (ref >39.00)
LDL Cholesterol: 99 mg/dL (ref 0–99)
NONHDL: 109.34
Triglycerides: 54 mg/dL (ref 0.0–149.0)
VLDL: 10.8 mg/dL (ref 0.0–40.0)

## 2017-05-20 LAB — TSH: TSH: 2.86 u[IU]/mL (ref 0.35–4.50)

## 2017-05-20 NOTE — Progress Notes (Signed)
   Subjective:    Patient ID: Debra Carlson, female    DOB: 1935/09/06, 82 y.o.   MRN: [id number]  HPI Here to follow up on issues. She feels well. Her arthritis acts up from time to time but Aleve gives her god pain relief. She was here last month for some vertigo but this resolved. Her BP is stable.    Review of Systems  Constitutional: Negative.   HENT: Negative.   Eyes: Negative.   Respiratory: Negative.   Cardiovascular: Negative.   Gastrointestinal: Negative.   Genitourinary: Negative for decreased urine volume, difficulty urinating, dyspareunia, dysuria, enuresis, flank pain, frequency, hematuria, pelvic pain and urgency.  Musculoskeletal: Positive for arthralgias. Negative for joint swelling.  Skin: Negative.   Neurological: Negative.   Psychiatric/Behavioral: Negative.        Objective:   Physical Exam  Constitutional: She is oriented to person, place, and time. She appears well-developed and well-nourished. No distress.  Using a cane   HENT:  Head: Normocephalic and atraumatic.  Right Ear: External ear normal.  Left Ear: External ear normal.  Nose: Nose normal.  Mouth/Throat: Oropharynx is clear and moist. No oropharyngeal exudate.  Eyes: Pupils are equal, round, and reactive to light. Conjunctivae and EOM are normal. No scleral icterus.  Neck: Normal range of motion. Neck supple. No JVD present. No thyromegaly present.  Cardiovascular: Normal rate, regular rhythm, normal heart sounds and intact distal pulses. Exam reveals no gallop and no friction rub.  No murmur heard. Pulmonary/Chest: Effort normal and breath sounds normal. No respiratory distress. She has no wheezes. She has no rales. She exhibits no tenderness.  Abdominal: Soft. Bowel sounds are normal. She exhibits no distension and no mass. There is no tenderness. There is no rebound and no guarding.  Musculoskeletal: Normal range of motion. She exhibits no edema or tenderness.  Lymphadenopathy:    She has  no cervical adenopathy.  Neurological: She is alert and oriented to person, place, and time. She has normal reflexes. She displays normal reflexes. No cranial nerve deficit. She exhibits normal muscle tone. Coordination normal.  Skin: Skin is warm and dry. No rash noted. No erythema.  Psychiatric: She has a normal mood and affect. Her behavior is normal. Judgment and thought content normal.          Assessment & Plan:  Her HTN is stable. The vertigo resolved. Her arthritis is stable and she gets good pain relief with Aleve. Gershon Crane, MD

## 2017-06-16 ENCOUNTER — Ambulatory Visit: Payer: Commercial Managed Care - HMO | Admitting: Nurse Practitioner

## 2017-07-11 ENCOUNTER — Other Ambulatory Visit: Payer: Self-pay | Admitting: Neurology

## 2017-07-28 ENCOUNTER — Other Ambulatory Visit: Payer: Self-pay | Admitting: Family Medicine

## 2017-07-28 DIAGNOSIS — Z1231 Encounter for screening mammogram for malignant neoplasm of breast: Secondary | ICD-10-CM

## 2017-08-08 ENCOUNTER — Ambulatory Visit (INDEPENDENT_AMBULATORY_CARE_PROVIDER_SITE_OTHER): Payer: Medicare HMO | Admitting: Family Medicine

## 2017-08-08 ENCOUNTER — Encounter: Payer: Self-pay | Admitting: Family Medicine

## 2017-08-08 VITALS — BP 150/76 | HR 94 | Temp 97.6°F | Wt 194.6 lb

## 2017-08-08 DIAGNOSIS — M940 Chondrocostal junction syndrome [Tietze]: Secondary | ICD-10-CM | POA: Diagnosis not present

## 2017-08-08 MED ORDER — DICLOFENAC SODIUM 75 MG PO TBEC: 75.0000 mg | DELAYED_RELEASE_TABLET | Freq: Two times a day (BID) | ORAL | 2 refills | Status: DC | PRN

## 2017-08-08 NOTE — Progress Notes (Signed)
   Subjective:    Patient ID: Debra Carlson, female    DOB: 1935/09/10, 82 y.o.   MRN: [id number]  HPI Here for intermittent sharp pains in the left chest area. Sometimes it feels like it is in her breast, other times it feels to be behind the breast. No cough or SOB. Aleve helps it. It flared up again 3 days ago so she came in to talk about it. Her last mammogram a year ago was normal, and she is scheduled for another one on 09-02-17.    Review of Systems  Constitutional: Negative.   Respiratory: Negative.   Cardiovascular: Positive for chest pain. Negative for palpitations and leg swelling.  Neurological: Negative.        Objective:   Physical Exam  Constitutional: She is oriented to person, place, and time. She appears well-developed and well-nourished.  Cardiovascular: Normal rate, regular rhythm, normal heart sounds and intact distal pulses.  Pulmonary/Chest: Effort normal and breath sounds normal. No stridor. No respiratory distress. She has no wheezes. She has no rales.  She is tender over the left chest wall, along the sternal margin and in the axilla. The breast itself is not tender, no lumps felt.   Neurological: She is alert and oriented to person, place, and time.          Assessment & Plan:  Costochondritis, treat with Diclofenac.  Gershon CraneStephen Fry, MD

## 2017-08-26 ENCOUNTER — Ambulatory Visit
Admission: RE | Admit: 2017-08-26 | Discharge: 2017-08-26 | Disposition: A | Discharge status: Home / Self Care | Payer: Medicare HMO | Source: Ambulatory Visit | Attending: Family Medicine | Admitting: Family Medicine

## 2017-08-26 DIAGNOSIS — Z1231 Encounter for screening mammogram for malignant neoplasm of breast: Secondary | ICD-10-CM | POA: Diagnosis not present

## 2017-10-18 NOTE — Progress Notes (Deleted)
[facility]  PATIENT: Debra Carlson DOB: Apr 25, 1935   REASON FOR VISIT: Follow-up for seizure disorder HISTORY FROM:    HISTORY OF PRESENT ILLNESS:  Debra Carlson, 82 year old female returns for followup. She has a history of seizure disorder and is currently taking Lamictal 50 mg twice daily. She denies side effects of the medication. Last seizure event occurred February 2007. EEG in the past was abnormal. No interval medical condition since last seen. Labs are followed by primary care She returns for reevaluation.   HISTORY: She has a history of generalized seizure in February 2007. She had had previous episodes of what she called vertigo, all episodes had similar semiology she felt strange and dizziness afterwards with mild confusion these episodes can last 5-15 minutes and then she would come around and be slow to respond. She was placed on Dilantin but could not tolerate the medication. She is currently on Lamictal without side effects and further episodes. She continues to drive without difficulty. EEG in the past have been abnormal with an irritable focus in the left temple area. MRI of the brain was normal   UPDATE June 15 2016:YYHe is doing well there was no recurrent seizure. REVIEW OF SYSTEMS: Full 14 system review of systems performed and notable only for those listed, all others are neg:  Constitutional: neg  Cardiovascular: neg Ear/Nose/Throat: neg  Skin: neg Eyes: neg Respiratory: neg Gastroitestinal: neg  Hematology/Lymphatic: neg  Endocrine: neg Musculoskeletal:neg Allergy/Immunology: neg Neurological: neg Psychiatric: neg Sleep : neg   ALLERGIES: No Known Allergies  HOME MEDICATIONS: Outpatient Medications Prior to Visit  Medication Sig Dispense Refill  . aspirin 81 MG tablet Take 81 mg by mouth daily.      . diclofenac (VOLTAREN) 75 MG EC tablet Take 1 tablet (75 mg total) by mouth 2 (two) times daily as needed for mild pain. 60  tablet 2  . lamoTRIgine (LAMICTAL) 25 MG tablet Take 2 tablets (50 mg total) by mouth 2 (two) times daily. Please discard previous 3 months supply, patient prefers monthly supple 120 tablet 11  . lamoTRIgine (LAMICTAL) 25 MG tablet TAKE 2 TABLETS (50 MG TOTAL) BY MOUTH 2 (TWO) TIMES DAILY. 360 tablet 1  . meclizine (ANTIVERT) 25 MG tablet Take 1 tablet (25 mg total) by mouth every 4 (four) hours as needed for dizziness. 60 tablet 2  . Multiple Vitamin (MULTIVITAMIN) tablet Take 1 tablet by mouth daily.    Marland Kitchen trolamine salicylate (ASPERCREME) 10 % cream Apply 1 application topically as needed (for knees).     No facility-administered medications prior to visit.     PAST MEDICAL HISTORY: Past Medical History:  Diagnosis Date  . Cataract   . Hypertension   . Osteoarthritis   . Seizure disorder [facility])    single seizure in 2005, sees Dr. Kelli Hope  . Seizures (HCC)    last seizure in 2011    PAST SURGICAL HISTORY: Past Surgical History:  Procedure Laterality Date  . CESAREAN SECTION    . TONSILLECTOMY      FAMILY HISTORY: Family History  Problem Relation Age of Onset  . Colon cancer Mother   . Breast cancer Maternal Aunt 70    SOCIAL HISTORY: Social History   Socioeconomic History  . Marital status: Married    Spouse name: Not on file  . Number of children: 1  . Years of education: 12+  . Highest education level: Not on file  Occupational History  . Occupation: Retired   Chief Executive Officer  Needs  . Financial resource strain: Not on file  . Food insecurity:    Worry: Not on file    Inability: Not on file  . Transportation needs:    Medical: Not on file    Non-medical: Not on file  Tobacco Use  . Smoking status: Never Smoker  . Smokeless tobacco: Never Used  Substance and Sexual Activity  . Alcohol use: No    Alcohol/week: 0.0 standard drinks  . Drug use: No  . Sexual activity: Not on file  Lifestyle  . Physical activity:    Days per week: Not on file    Minutes  per session: Not on file  . Stress: Not on file  Relationships  . Social connections:    Talks on phone: Not on file    Gets together: Not on file    Attends religious service: Not on file    Active member of club or organization: Not on file    Attends meetings of clubs or organizations: Not on file    Relationship status: Not on file  . Intimate partner violence:    Fear of current or ex partner: Not on file    Emotionally abused: Not on file    Physically abused: Not on file    Forced sexual activity: Not on file  Other Topics Concern  . Not on file  Social History Narrative   Married    Never Smoked   Alcohol use- no   Drug use- no           PHYSICAL EXAM  There were no vitals filed for this visit. There is no height or weight on file to calculate BMI.  Generalized: Well developed, in no acute distress  Head: normocephalic and atraumatic,. Oropharynx benign  Neck: Supple, no carotid bruits  Cardiac: Regular rate rhythm, no murmur  Musculoskeletal: No deformity   Neurological examination   Mentation: Alert oriented to time, place, history taking. Attention span and concentration appropriate. Recent and remote memory intact.  Follows all commands speech and language fluent.   Cranial nerve II-XII: Fundoscopic exam reveals sharp disc margins.Pupils were equal round reactive to light extraocular movements were full, visual field were full on confrontational test. Facial sensation and strength were normal. hearing was intact to finger rubbing bilaterally. Uvula tongue midline. head turning and shoulder shrug were normal and symmetric.Tongue protrusion into cheek strength was normal. Motor: normal bulk and tone, full strength in the BUE, BLE, fine finger movements normal, no pronator drift. No focal weakness Sensory: normal and symmetric to light touch, pinprick, and  Vibration, proprioception  Coordination: finger-nose-finger, heel-to-shin bilaterally, no  dysmetria Reflexes: Brachioradialis 2/2, biceps 2/2, triceps 2/2, patellar 2/2, Achilles 2/2, plantar responses were flexor bilaterally. Gait and Station: Rising up from seated position without assistance, normal stance,  moderate stride, good arm swing, smooth turning, able to perform tiptoe, and heel walking without difficulty. Tandem gait is steady  DIAGNOSTIC DATA (LABS, IMAGING, TESTING) - I reviewed patient records, labs, notes, testing and imaging myself where available.  Lab Results  Component Value Date   WBC 5.5 05/20/2017   HGB 13.9 05/20/2017   HCT 41.7 05/20/2017   MCV 90.9 05/20/2017   PLT 219.0 05/20/2017      Component Value Date/Time   NA 141 05/20/2017 1102   K 4.3 05/20/2017 1102   CL 104 05/20/2017 1102   CO2 30 05/20/2017 1102   GLUCOSE 119 (H) 05/20/2017 1102   GLUCOSE 110 (H) 12/10/2005 1144  BUN 17 05/20/2017 1102   CREATININE 0.81 05/20/2017 1102   CALCIUM 9.3 05/20/2017 1102   PROT 6.8 05/20/2017 1102   ALBUMIN 4.0 05/20/2017 1102   AST 10 05/20/2017 1102   ALT 8 05/20/2017 1102   ALKPHOS 85 05/20/2017 1102   BILITOT 0.7 05/20/2017 1102   GFRNONAA >60 07/12/2014 2133   GFRAA >60 07/12/2014 2133   Lab Results  Component Value Date   CHOL 155 05/20/2017   HDL 45.90 05/20/2017   LDLCALC 99 05/20/2017   TRIG 54.0 05/20/2017   CHOLHDL 3 05/20/2017   Lab Results  Component Value Date   HGBA1C 6.1 (H) 12/10/2005   No results found for: VITAMINB12 Lab Results  Component Value Date   TSH 2.86 05/20/2017    ***  ASSESSMENT AND PLAN  82 y.o. year old female  has a past medical history of Cataract, Hypertension, Osteoarthritis, Seizure disorder (HCC), and Seizures (HCC). here with ***  82y.o. year old female Complex partial seizure             Keep current dose of lamotrigine 50 mg twice a day  [name], [facility], [facility], APRN  [facility] [address] [place], [place] [address] [phone]

## 2017-10-20 ENCOUNTER — Ambulatory Visit: Payer: Commercial Managed Care - HMO | Admitting: Nurse Practitioner

## 2017-11-10 ENCOUNTER — Ambulatory Visit: Payer: Medicare HMO | Admitting: Family Medicine

## 2017-11-14 ENCOUNTER — Other Ambulatory Visit: Payer: Self-pay | Admitting: Family Medicine

## 2017-11-17 ENCOUNTER — Encounter: Payer: Self-pay | Admitting: Family Medicine

## 2017-11-17 ENCOUNTER — Ambulatory Visit (INDEPENDENT_AMBULATORY_CARE_PROVIDER_SITE_OTHER): Payer: Medicare HMO | Admitting: Family Medicine

## 2017-11-17 VITALS — BP 160/90 | HR 92 | Temp 97.7°F | Wt 192.2 lb

## 2017-11-17 DIAGNOSIS — R319 Hematuria, unspecified: Secondary | ICD-10-CM

## 2017-11-17 LAB — POCT URINALYSIS DIPSTICK
Bilirubin, UA: NEGATIVE
Glucose, UA: NEGATIVE
KETONES UA: NEGATIVE
LEUKOCYTES UA: NEGATIVE
NITRITE UA: NEGATIVE
PH UA: 6 (ref 5.0–8.0)
Protein, UA: NEGATIVE
RBC UA: NEGATIVE
Spec Grav, UA: >=1.03 — AB (ref 1.010–1.025)
UROBILINOGEN UA: NEGATIVE U/dL — AB

## 2017-11-17 MED ORDER — NITROFURANTOIN MONOHYD MACRO 100 MG PO CAPS: 100.0000 mg | ORAL_CAPSULE | Freq: Two times a day (BID) | ORAL | 0 refills | Status: DC

## 2017-11-17 NOTE — Progress Notes (Signed)
   Subjective:    Patient ID: Debra Carlson, female    DOB: Jul 22, 1935, 82 y.o.   MRN: [id number]  HPI Here for several days of noticing red blood on the tissue when she wipes after urinating. She has always had urinary frequency and urgency. No burning or back pain or fever. She has had some lower abdominal pains for a few days. Her BMs are normal with no signs of blood.    Review of Systems  Constitutional: Negative.   Respiratory: Negative.   Cardiovascular: Negative.   Gastrointestinal: Positive for abdominal pain. Negative for abdominal distention, anal bleeding, blood in stool, constipation, diarrhea, nausea, rectal pain and vomiting.  Genitourinary: Positive for frequency, hematuria and urgency. Negative for dysuria, flank pain, pelvic pain and vaginal bleeding.       Objective:   Physical Exam  Constitutional: She is oriented to person, place, and time. She appears well-developed and well-nourished. No distress.  Cardiovascular: Normal rate, regular rhythm, normal heart sounds and intact distal pulses.  Pulmonary/Chest: Effort normal and breath sounds normal.  Abdominal: Soft. Bowel sounds are normal. She exhibits no distension and no mass. There is no tenderness. There is no rebound and no guarding.  Neurological: She is alert and oriented to person, place, and time.          Assessment & Plan:  Transient hematuria, possibly due to a UTI. We will treat with Macrobid. Culture the sample. Recheck prn.  Gershon Crane, MD

## 2017-11-18 LAB — URINE CULTURE
MICRO NUMBER: 91342733
Result:: NO GROWTH
SPECIMEN QUALITY: ADEQUATE

## 2018-01-16 ENCOUNTER — Other Ambulatory Visit: Payer: Self-pay | Admitting: Neurology

## 2018-02-07 ENCOUNTER — Ambulatory Visit (INDEPENDENT_AMBULATORY_CARE_PROVIDER_SITE_OTHER): Payer: Medicare HMO | Admitting: Family Medicine

## 2018-02-07 ENCOUNTER — Encounter: Payer: Self-pay | Admitting: Family Medicine

## 2018-02-07 ENCOUNTER — Telehealth: Payer: Self-pay | Admitting: Family Medicine

## 2018-02-07 VITALS — BP 140/82 | HR 93 | Temp 97.9°F | Wt 189.4 lb

## 2018-02-07 DIAGNOSIS — J209 Acute bronchitis, unspecified: Secondary | ICD-10-CM

## 2018-02-07 MED ORDER — HYDROCODONE-HOMATROPINE 5-1.5 MG/5ML PO SYRP: 5.0000 mL | ORAL_SOLUTION | ORAL | 0 refills | Status: DC | PRN

## 2018-02-07 MED ORDER — AZITHROMYCIN 250 MG PO TABS: ORAL_TABLET | ORAL | 0 refills | Status: DC

## 2018-02-07 NOTE — Progress Notes (Signed)
   Subjective:    Patient ID: SHAKEIA LAMPKIN, female    DOB: 1935-08-30, 83 y.o.   MRN: [id number]  HPI Here for 10 days of chest tightness, wheezing and coughing up yellow sputum. No fever.     Review of Systems  Constitutional: Negative.   HENT: Negative.   Eyes: Negative.   Respiratory: Positive for cough, chest tightness and wheezing.   Cardiovascular: Negative.        Objective:   Physical Exam Constitutional:      Appearance: Normal appearance. She is not ill-appearing.  HENT:     Right Ear: Tympanic membrane and ear canal normal.     Left Ear: Tympanic membrane and ear canal normal.     Nose: Nose normal.     Mouth/Throat:     Pharynx: Oropharynx is clear.  Eyes:     Conjunctiva/sclera: Conjunctivae normal.  Pulmonary:     Effort: Pulmonary effort is normal. No respiratory distress.     Breath sounds: No rales.     Comments: Scattered rhonchi and wheezes  Lymphadenopathy:     Cervical: No cervical adenopathy.  Neurological:     Mental Status: She is alert.           Assessment & Plan:  Bronchitis, treat with a Zpack.  Gershon Crane, MD

## 2018-02-07 NOTE — Telephone Encounter (Signed)
I sent in some Hydromet  

## 2018-02-07 NOTE — Telephone Encounter (Signed)
Dr. Clent Ridges please advise on the cough meds you would like for the pt. Thanks

## 2018-02-07 NOTE — Telephone Encounter (Signed)
Called and spoke with pt and she is aware of rx that have been sent to the pharmacy for the pt.

## 2018-04-21 ENCOUNTER — Other Ambulatory Visit: Payer: Self-pay | Admitting: Family Medicine

## 2018-06-30 ENCOUNTER — Other Ambulatory Visit: Payer: Self-pay | Admitting: Family Medicine

## 2018-07-21 ENCOUNTER — Other Ambulatory Visit: Payer: Self-pay | Admitting: Family Medicine

## 2018-08-14 ENCOUNTER — Other Ambulatory Visit: Payer: Self-pay | Admitting: Family Medicine

## 2018-08-14 DIAGNOSIS — Z1231 Encounter for screening mammogram for malignant neoplasm of breast: Secondary | ICD-10-CM

## 2018-08-27 ENCOUNTER — Other Ambulatory Visit: Payer: Self-pay | Admitting: Family Medicine

## 2018-09-24 ENCOUNTER — Other Ambulatory Visit: Payer: Self-pay | Admitting: Family Medicine

## 2018-09-27 ENCOUNTER — Ambulatory Visit: Payer: Medicare HMO

## 2018-09-27 ENCOUNTER — Ambulatory Visit: Payer: Self-pay | Admitting: *Deleted

## 2018-09-27 NOTE — Telephone Encounter (Signed)
Appt scheduled with PCP 9/23, pt wanted to wait to see him, declined visit with another provider.

## 2018-09-27 NOTE — Telephone Encounter (Signed)
Spoke to pt and she stated that the bleeding is not going on right now. Pt was only making an appt. Before the bleeding starts again to see what she should do. Pt stated that the 23rd is okay to wait. N ofuther actions needed. Routing to PCP as FYI.

## 2018-09-27 NOTE — Telephone Encounter (Signed)
Pt called with having some vaginal bleeding that first started in June and again in August and now the last time being September 3 rd.  The bleeding has been from a few drops to about the size of a half dollar.  She thinks it is from taking the diclofenac so she stopped taking this medication to see if the bleeding stops. One of the side effects is bleeding of this medication. She denies abd pain or fever. No protocol noted for disposition. advised to call back for heavy bleeding with clots, abd pain and/or bleeding. She voiced understanding. LB at [facility] notified regarding an appointment within 2 days. Routing to the practice. Answer Assessment - Initial Assessment Questions 1. AMOUNT: "Describe the bleeding that you are having."    - SPOTTING: spotting, or pinkish / brownish mucous discharge; does not fill panti-liner or pad    - MILD:  less than 1 pad / hour; less than patient's usual menstrual bleeding   - MODERATE: 1-2 pads / hour; 1 menstrual cup every 6 hours; small-medium blood clots (e.g., pea, grape, small coin)   - SEVERE: soaking 2 or more pads/hour for 2 or more hours; 1 menstrual cup every 2 hours; bleeding not contained by pads or continuous red blood from vagina; large blood clots (e.g., golf ball, large coin)      About the size of a quarter or fifth cent piece 2. ONSET: "When did the bleeding begin?" "Is it continuing now?"     The first time in June 3. MENSTRUAL PERIOD: "When was the last normal menstrual period?" "How is this different than your period?"     n/a 4. REGULARITY: "How regular are your periods?"     n/a 5. ABDOMINAL PAIN: "Do you have any pain?" "How bad is the pain?"  (e.g., Scale 1-10; mild, moderate, or severe)   - MILD (1-3): doesn't interfere with normal activities, abdomen soft and not tender to touch    - MODERATE (4-7): interferes with normal activities or awakens from sleep, tender to touch    - SEVERE (8-10): excruciating pain, doubled over,  unable to do any normal activities      First time in June and then again in August and the last time was September 6. PREGNANCY: "Could you be pregnant?" "Are you sexually active?" "Did you recently give birth?"     n/a 7. BREASTFEEDING: "Are you breastfeeding?"     n/a 8. HORMONES: "Are you taking any hormone medications, prescription or OTC?" (e.g., birth control pills, estrogen)    none 9. BLOOD THINNERS: "Do you take any blood thinners?" (e.g., Coumadin/warfarin, Pradaxa/dabigatran, aspirin)     no 10. CAUSE: "What do you think is causing the bleeding?" (e.g., recent gyn surgery, recent gyn procedure; known bleeding disorder, cervical cancer, polycystic ovarian disease, fibroids)         Thinks it is the diclofenac  11. HEMODYNAMIC STATUS: "Are you weak or feeling lightheaded?" If so, ask: "Can you stand and walk normally?"        no 12. OTHER SYMPTOMS: "What other symptoms are you having with the bleeding?" (e.g., passed tissue, vaginal discharge, fever, menstrual-type cramps)       none  Protocols used: VAGINAL BLEEDING - ABNORMAL-A-AH

## 2018-09-28 NOTE — Telephone Encounter (Signed)
I agree. I will see her on the 23rd.

## 2018-10-04 ENCOUNTER — Other Ambulatory Visit: Payer: Self-pay

## 2018-10-04 ENCOUNTER — Encounter: Payer: Self-pay | Admitting: Family Medicine

## 2018-10-04 ENCOUNTER — Ambulatory Visit (INDEPENDENT_AMBULATORY_CARE_PROVIDER_SITE_OTHER): Payer: Medicare HMO | Admitting: Family Medicine

## 2018-10-04 VITALS — BP 162/84 | HR 96 | Temp 97.3°F | Ht 61.5 in | Wt 190.2 lb

## 2018-10-04 DIAGNOSIS — N939 Abnormal uterine and vaginal bleeding, unspecified: Secondary | ICD-10-CM | POA: Diagnosis not present

## 2018-10-04 DIAGNOSIS — N390 Urinary tract infection, site not specified: Secondary | ICD-10-CM

## 2018-10-04 DIAGNOSIS — R319 Hematuria, unspecified: Secondary | ICD-10-CM

## 2018-10-04 LAB — POCT URINALYSIS DIPSTICK
Bilirubin, UA: NEGATIVE
Blood, UA: NEGATIVE
Glucose, UA: NEGATIVE
Ketones, UA: NEGATIVE
Nitrite, UA: POSITIVE
Protein, UA: NEGATIVE
Spec Grav, UA: 1.015 (ref 1.010–1.025)
Urobilinogen, UA: 0.2 E.U./dL
pH, UA: 6 (ref 5.0–8.0)

## 2018-10-04 MED ORDER — CIPROFLOXACIN HCL 500 MG PO TABS: 500.0000 mg | ORAL_TABLET | Freq: Two times a day (BID) | ORAL | 0 refills | Status: DC

## 2018-10-04 NOTE — Progress Notes (Signed)
   Subjective:    Patient ID: Debra Carlson, female    DOB: 1935/04/04, 83 y.o.   MRN: [id number]  HPI Here for some perineal bleeding that she had noticed for several weeks last month. She says she would see a small amount of red blood on the tissue when she wipes after urinating. Then she had an episode of frank bleeding for a bout 4 days which acted like a menstrual cycle. She read where taking Diclofenac can cause bleeding problems, so she stopped taking this 3 weeks ago. Since then she has not seen any blood at all. No burning or urgency to urinate. No back or pelvic pain. No fever or nausea. BMs are regular.    Review of Systems  Constitutional: Negative.   Respiratory: Negative.   Cardiovascular: Negative.   Gastrointestinal: Negative.   Genitourinary: Positive for hematuria. Negative for difficulty urinating, dysuria, flank pain, frequency, pelvic pain and urgency.       Objective:   Physical Exam Constitutional:      Appearance: Normal appearance. She is not ill-appearing.  Cardiovascular:     Rate and Rhythm: Normal rate and regular rhythm.     Pulses: Normal pulses.     Heart sounds: Normal heart sounds.  Pulmonary:     Effort: Pulmonary effort is normal.     Breath sounds: Normal breath sounds.  Abdominal:     General: Abdomen is flat. Bowel sounds are normal. There is no distension.     Palpations: Abdomen is soft. There is no mass.     Tenderness: There is no abdominal tenderness. There is no guarding or rebound.     Hernia: No hernia is present.  Neurological:     Mental Status: She is alert.           Assessment & Plan:  She seems to have a UTI so the blood is most likely from the urethra. Treat with Cipro for 7 days. We will send the sample for a culture. She will let us know if she sees any more bleeding.  Alysia Penna, MD

## 2018-10-06 ENCOUNTER — Telehealth: Payer: Self-pay | Admitting: Family Medicine

## 2018-10-06 LAB — URINE CULTURE
MICRO NUMBER:: 914020
SPECIMEN QUALITY:: ADEQUATE

## 2018-10-06 MED ORDER — NITROFURANTOIN MONOHYD MACRO 100 MG PO CAPS: 100.0000 mg | ORAL_CAPSULE | Freq: Two times a day (BID) | ORAL | 0 refills | Status: DC

## 2018-10-06 NOTE — Telephone Encounter (Signed)
Please advise 

## 2018-10-06 NOTE — Telephone Encounter (Signed)
Rx sent in electronically. Pt notified of update.

## 2018-10-06 NOTE — Telephone Encounter (Signed)
Cancel the Cipro and call in Macrobid 100 mg bid for 7 days  

## 2018-10-06 NOTE — Telephone Encounter (Signed)
Pt suppose to take generic cipro 500 mg twice a day. Pt read the side effects and she does have seizures and she is over 60 and does not want to take this abx. Pt would like another abx call into [facility] [address]

## 2018-10-09 NOTE — Addendum Note (Signed)
Addended by: Gwenyth Ober R on: 10/09/2018 04:39 PM   Modules accepted: Orders

## 2018-11-14 ENCOUNTER — Ambulatory Visit: Payer: Medicare HMO

## 2019-01-03 ENCOUNTER — Ambulatory Visit: Payer: Medicare HMO

## 2019-02-19 ENCOUNTER — Ambulatory Visit
Admission: RE | Admit: 2019-02-19 | Discharge: 2019-02-19 | Disposition: A | Discharge status: Home / Self Care | Payer: Medicare HMO | Source: Ambulatory Visit | Attending: Family Medicine | Admitting: Family Medicine

## 2019-02-19 ENCOUNTER — Other Ambulatory Visit: Payer: Self-pay

## 2019-02-19 DIAGNOSIS — Z1231 Encounter for screening mammogram for malignant neoplasm of breast: Secondary | ICD-10-CM

## 2019-03-12 ENCOUNTER — Telehealth: Payer: Self-pay | Admitting: Family Medicine

## 2019-03-12 NOTE — Telephone Encounter (Signed)
She should go ahead and get the Covid vaccine asap. As for the "period", ask her what she means by this. If she is having vaginal bleeding, we need need to have her see GYN

## 2019-03-12 NOTE — Telephone Encounter (Signed)
Pt is having what seems like a period. He last one was around Dec of 2020. She is wondering if she needs to set up a CPE before she receives the Flu shot or if it is ok to go ahead and get it?  Pt can be reached at [phone]

## 2019-03-12 NOTE — Telephone Encounter (Signed)
Left message for patient to call back  

## 2019-03-19 NOTE — Telephone Encounter (Signed)
Left message for patient to call back  

## 2019-04-10 ENCOUNTER — Other Ambulatory Visit: Payer: Self-pay

## 2019-04-11 ENCOUNTER — Ambulatory Visit (INDEPENDENT_AMBULATORY_CARE_PROVIDER_SITE_OTHER): Payer: Medicare HMO | Admitting: Family Medicine

## 2019-04-11 ENCOUNTER — Encounter: Payer: Self-pay | Admitting: Family Medicine

## 2019-04-11 VITALS — BP 140/80 | HR 102 | Temp 97.9°F | Ht 62.0 in | Wt 193.0 lb

## 2019-04-11 DIAGNOSIS — Z0000 Encounter for general adult medical examination without abnormal findings: Secondary | ICD-10-CM

## 2019-04-11 DIAGNOSIS — M25561 Pain in right knee: Secondary | ICD-10-CM

## 2019-04-11 DIAGNOSIS — M25562 Pain in left knee: Secondary | ICD-10-CM

## 2019-04-11 DIAGNOSIS — G8929 Other chronic pain: Secondary | ICD-10-CM

## 2019-04-11 LAB — URINALYSIS, ROUTINE W REFLEX MICROSCOPIC
Bilirubin Urine: NEGATIVE
Nitrite: POSITIVE — AB
Specific Gravity, Urine: 1.025 (ref 1.000–1.030)
Urine Glucose: NEGATIVE
Urobilinogen, UA: 0.2 (ref 0.0–1.0)
pH: 5.5 (ref 5.0–8.0)

## 2019-04-11 LAB — TSH: TSH: 2.41 u[IU]/mL (ref 0.35–4.50)

## 2019-04-11 LAB — CBC WITH DIFFERENTIAL/PLATELET
Basophils Absolute: 0 10*3/uL (ref 0.0–0.1)
Basophils Relative: 0.9 % (ref 0.0–3.0)
Eosinophils Absolute: 0.1 10*3/uL (ref 0.0–0.7)
Eosinophils Relative: 3.2 % (ref 0.0–5.0)
HCT: 41.5 % (ref 36.0–46.0)
Hemoglobin: 13.8 g/dL (ref 12.0–15.0)
Lymphocytes Relative: 31.8 % (ref 12.0–46.0)
Lymphs Abs: 1.4 10*3/uL (ref 0.7–4.0)
MCHC: 33.2 g/dL (ref 30.0–36.0)
MCV: 91.1 fl (ref 78.0–100.0)
Monocytes Absolute: 0.6 10*3/uL (ref 0.1–1.0)
Monocytes Relative: 14.6 % — ABNORMAL HIGH (ref 3.0–12.0)
Neutro Abs: 2.1 10*3/uL (ref 1.4–7.7)
Neutrophils Relative %: 49.5 % (ref 43.0–77.0)
Platelets: 192 10*3/uL (ref 150.0–400.0)
RBC: 4.56 Mil/uL (ref 3.87–5.11)
RDW: 12.5 % (ref 11.5–15.5)
WBC: 4.3 10*3/uL (ref 4.0–10.5)

## 2019-04-11 LAB — BASIC METABOLIC PANEL
BUN: 11 mg/dL (ref 6–23)
CO2: 31 mEq/L (ref 19–32)
Calcium: 9.4 mg/dL (ref 8.4–10.5)
Chloride: 104 mEq/L (ref 96–112)
Creatinine, Ser: 0.71 mg/dL (ref 0.40–1.20)
GFR: 95.38 mL/min (ref >60.00)
Glucose, Bld: 121 mg/dL — ABNORMAL HIGH (ref 70–99)
Potassium: 3.9 mEq/L (ref 3.5–5.1)
Sodium: 140 mEq/L (ref 135–145)

## 2019-04-11 LAB — LIPID PANEL
Cholesterol: 165 mg/dL (ref 0–200)
HDL: 41.2 mg/dL (ref >39.00)
LDL Cholesterol: 112 mg/dL — ABNORMAL HIGH (ref 0–99)
NonHDL: 123.97
Total CHOL/HDL Ratio: 4
Triglycerides: 59 mg/dL (ref 0.0–149.0)
VLDL: 11.8 mg/dL (ref 0.0–40.0)

## 2019-04-11 LAB — HEPATIC FUNCTION PANEL
ALT: 10 U/L (ref 0–35)
AST: 13 U/L (ref 0–37)
Albumin: 4.1 g/dL (ref 3.5–5.2)
Alkaline Phosphatase: 84 U/L (ref 39–117)
Bilirubin, Direct: 0.2 mg/dL (ref 0.0–0.3)
Total Bilirubin: 1.3 mg/dL — ABNORMAL HIGH (ref 0.2–1.2)
Total Protein: 6.7 g/dL (ref 6.0–8.3)

## 2019-04-11 MED ORDER — LAMOTRIGINE 25 MG PO TABS: 50.0000 mg | ORAL_TABLET | Freq: Every day | ORAL | 11 refills | Status: DC

## 2019-04-11 NOTE — Progress Notes (Signed)
Subjective:    Patient ID: Debra Carlson, female    DOB: April 24, 1935, 84 y.o.   MRN: [id number]  HPI Here for a well exam. Her only complaints are seeing occasional blood in her urine and bilateral knee pain. Last year she had an E coli UTI which we treated with Macrobid. Lately she has again seen some blood in the urine. No urgency or burning. Her knees have had pain for years and she takes Tylenol as needed for this.    Review of Systems  Constitutional: Negative.   HENT: Negative.   Eyes: Negative.   Respiratory: Negative.   Cardiovascular: Negative.   Gastrointestinal: Negative.   Genitourinary: Positive for hematuria. Negative for decreased urine volume, difficulty urinating, dyspareunia, dysuria, enuresis, flank pain, frequency, pelvic pain and urgency.  Musculoskeletal: Positive for arthralgias.  Skin: Negative.   Neurological: Negative.   Psychiatric/Behavioral: Negative.        Objective:   Physical Exam Constitutional:      General: She is not in acute distress.    Appearance: She is well-developed.     Comments: She walks with a cane   HENT:     Head: Normocephalic and atraumatic.     Right Ear: External ear normal.     Left Ear: External ear normal.     Nose: Nose normal.     Mouth/Throat:     Pharynx: No oropharyngeal exudate.  Eyes:     General: No scleral icterus.    Conjunctiva/sclera: Conjunctivae normal.     Pupils: Pupils are equal, round, and reactive to light.  Neck:     Thyroid: No thyromegaly.     Vascular: No JVD.  Cardiovascular:     Rate and Rhythm: Normal rate and regular rhythm.     Heart sounds: Normal heart sounds. No murmur. No friction rub. No gallop.   Pulmonary:     Effort: Pulmonary effort is normal. No respiratory distress.     Breath sounds: Normal breath sounds. No wheezing or rales.  Chest:     Chest wall: No tenderness.  Abdominal:     General: Bowel sounds are normal. There is no distension.     Palpations: Abdomen  is soft. There is no mass.     Tenderness: There is no abdominal tenderness. There is no guarding or rebound.  Musculoskeletal:        General: No tenderness. Normal range of motion.     Cervical back: Normal range of motion and neck supple.  Lymphadenopathy:     Cervical: No cervical adenopathy.  Skin:    General: Skin is warm and dry.     Findings: No erythema or rash.  Neurological:     Mental Status: She is alert and oriented to person, place, and time.     Cranial Nerves: No cranial nerve deficit.     Motor: No abnormal muscle tone.     Coordination: Coordination normal.     Deep Tendon Reflexes: Reflexes are normal and symmetric. Reflexes normal.  Psychiatric:        Behavior: Behavior normal.        Thought Content: Thought content normal.        Judgment: Judgment normal.           Assessment & Plan:  Well exam. We discussed diet and exercise. We will get fasting labs today, including a UA. She has osteoarthritis, especially in the knees, and we will refer her to Orthopedics for this.  Jeannett Senior  Sarajane Jews, MD

## 2019-04-12 ENCOUNTER — Other Ambulatory Visit: Payer: Self-pay

## 2019-04-12 MED ORDER — SULFAMETHOXAZOLE-TRIMETHOPRIM 800-160 MG PO TABS: 1.0000 | ORAL_TABLET | Freq: Two times a day (BID) | ORAL | 0 refills | Status: DC

## 2019-04-16 ENCOUNTER — Other Ambulatory Visit: Payer: Self-pay

## 2019-04-16 ENCOUNTER — Ambulatory Visit: Payer: Medicare HMO | Admitting: Orthopaedic Surgery

## 2019-04-16 ENCOUNTER — Ambulatory Visit (INDEPENDENT_AMBULATORY_CARE_PROVIDER_SITE_OTHER): Payer: Medicare HMO

## 2019-04-16 ENCOUNTER — Ambulatory Visit: Payer: Self-pay

## 2019-04-16 VITALS — Ht 62.0 in | Wt 192.0 lb

## 2019-04-16 DIAGNOSIS — M25561 Pain in right knee: Secondary | ICD-10-CM

## 2019-04-16 DIAGNOSIS — M25562 Pain in left knee: Secondary | ICD-10-CM | POA: Diagnosis not present

## 2019-04-16 DIAGNOSIS — M1711 Unilateral primary osteoarthritis, right knee: Secondary | ICD-10-CM | POA: Insufficient documentation

## 2019-04-16 DIAGNOSIS — M1712 Unilateral primary osteoarthritis, left knee: Secondary | ICD-10-CM

## 2019-04-16 MED ORDER — LIDOCAINE HCL 1 % IJ SOLN
3.0000 mL | INTRAMUSCULAR | Status: AC | PRN
  Administered 2019-04-16: 14:00:00 3 mL

## 2019-04-16 MED ORDER — METHYLPREDNISOLONE ACETATE 40 MG/ML IJ SUSP
40.0000 mg | INTRAMUSCULAR | Status: AC | PRN
  Administered 2019-04-16: 14:00:00 40 mg via INTRA_ARTICULAR

## 2019-04-16 MED ORDER — METHYLPREDNISOLONE ACETATE 40 MG/ML IJ SUSP
40.0000 mg | INTRAMUSCULAR | Status: AC | PRN
  Administered 2019-04-16: 40 mg via INTRA_ARTICULAR

## 2019-04-16 MED ORDER — LIDOCAINE HCL 1 % IJ SOLN
3.0000 mL | INTRAMUSCULAR | Status: AC | PRN
  Administered 2019-04-16: 3 mL

## 2019-04-16 NOTE — Progress Notes (Signed)
Office Visit Note   Patient: Debra Carlson           Date of Birth: [birth year]           MRN: [id number] Visit Date: 04/16/2019              Requested by: Nelwyn Salisbury, MD [address] [place],  [place] [address] PCP: Nelwyn Salisbury, MD   Assessment & Plan: Visit Diagnoses:  1. Left knee pain, unspecified chronicity   2. Right knee pain, unspecified chronicity   3. Unilateral primary osteoarthritis, right knee   4. Unilateral primary osteoarthritis, left knee     Plan: I talked in detail to the patient about her knees.  She has severe end-stage arthritis of both her knees.  I have recommended a steroid shot in both knees today.  She wants to stay with conservative treatment for now.  I showed her knee model explained what arthritis means for her and for her knees.  She did tolerate steroid injections in both knees well today.  She will return in about 3 months to consider these injections again.  She is beyond the stage of arthritis where hyaluronic acid will help at all.  All question concerns were answered and addressed.  Follow-Up Instructions: Return in about 3 months (around 07/16/2019).   Orders:  Orders Placed This Encounter  Procedures  . Large Joint Inj  . Large Joint Inj  . XR Knee 1-2 Views Right  . XR Knee 1-2 Views Left   No orders of the defined types were placed in this encounter.     Procedures: Large Joint Inj: R knee on 04/16/2019 1:58 PM Indications: diagnostic evaluation and pain Details: 22 G 1.5 in needle, superolateral approach  Arthrogram: No  Medications: 3 mL lidocaine 1 %; 40 mg methylPREDNISolone acetate 40 MG/ML Outcome: tolerated well, no immediate complications Procedure, treatment alternatives, risks and benefits explained, specific risks discussed. Consent was given by the patient. Immediately prior to procedure a time out was called to verify the correct patient, procedure, equipment, support staff and site/side marked as  required. Patient was prepped and draped in the usual sterile fashion.   Large Joint Inj: L knee on 04/16/2019 1:58 PM Indications: diagnostic evaluation and pain Details: 22 G 1.5 in needle, superolateral approach  Arthrogram: No  Medications: 3 mL lidocaine 1 %; 40 mg methylPREDNISolone acetate 40 MG/ML Outcome: tolerated well, no immediate complications Procedure, treatment alternatives, risks and benefits explained, specific risks discussed. Consent was given by the patient. Immediately prior to procedure a time out was called to verify the correct patient, procedure, equipment, support staff and site/side marked as required. Patient was prepped and draped in the usual sterile fashion.       Clinical Data: No additional findings.   Subjective: Chief Complaint  Patient presents with  . Left Knee - Pain  . Right Knee - Pain  The patient is a very pleasant 84 year old female who comes in for evaluation treatment of chronic bilateral knee pain.  She says they have been hurting for a very long period of time.  She does ambulate with a cane.  She states that she has pretty much constant pain with her knees at this point.  Her pain is present when she first gets up to walk after sitting for a while.  She has problems going up and down stairs due to her knee pain.  She denies any other significant medical issues.  She is  not a diabetic.  She has never had surgery on her knees.  She is not a smoker.  HPI  Review of Systems She currently denies any headache, chest pain, shortness of breath, fever, chills, nausea, vomiting  Objective: Vital Signs: Ht 5\' 2"  (1.575 m)   Wt 192 lb (87.1 kg)   BMI 35.12 kg/m   Physical Exam She is alert and orient x3 and in no acute distress Ortho Exam Examination of both knees shows varus malalignment.  Both knees have significant patellofemoral crepitation.  Both knees have full range of motion but a painful arc of motion.  Both knees feel ligamentously  stable and are stiff.  Both knees have quite significant medial joint line tenderness. Specialty Comments:  No specialty comments available.  Imaging: XR Knee 1-2 Views Left  Result Date: 04/16/2019 2 views of the left knee show severe end-stage arthritis of the left knee.  There is varus malalignment and complete loss of the medial joint space.  There is severe patellofemoral arthritic changes and large osteophytes throughout the knee.  XR Knee 1-2 Views Right  Result Date: 04/16/2019 2 views of the right knee show severe end-stage arthritis.  There is varus malalignment with complete loss of the joint space.  There is severe patellofemoral disease and large osteophytes throughout the knee.    PMFS History: Patient Active Problem List   Diagnosis Date Noted  . Unilateral primary osteoarthritis, right knee 04/16/2019  . Unilateral primary osteoarthritis, left knee 04/16/2019  . Vertigo 04/20/2017  . Partial symptomatic epilepsy with complex partial seizures, not intractable, without status epilepticus ([place]) 06/15/2016  . Bilateral leg edema 09/03/2014  . Convulsions ([place]) 02/25/2009  . Essential hypertension 01/18/2007  . Osteoarthritis 01/18/2007   Past Medical History:  Diagnosis Date  . Cataract   . Hypertension   . Osteoarthritis   . Seizure disorder [facility])    single seizure in 2005, sees Dr. Elvia Collum  . Seizures ([place])    last seizure in 2011    Family History  Problem Relation Age of Onset  . Colon cancer Mother   . Breast cancer Maternal Aunt 58    Past Surgical History:  Procedure Laterality Date  . CESAREAN SECTION    . TONSILLECTOMY     Social History   Occupational History  . Occupation: Retired   Tobacco Use  . Smoking status: Never Smoker  . Smokeless tobacco: Never Used  Substance and Sexual Activity  . Alcohol use: No    Alcohol/week: 0.0 standard drinks  . Drug use: No  . Sexual activity: Not on file

## 2019-04-30 ENCOUNTER — Telehealth: Payer: Self-pay | Admitting: Family Medicine

## 2019-04-30 DIAGNOSIS — R319 Hematuria, unspecified: Secondary | ICD-10-CM

## 2019-04-30 NOTE — Addendum Note (Signed)
Addended by: Solon Augusta on: 04/30/2019 01:05 PM   Modules accepted: Orders

## 2019-04-30 NOTE — Telephone Encounter (Signed)
Pt states she had internal bleeding and thinks it maybe her bladder but did not find out where it was coming from?   Pt states she has taken her medication for it and it is gone but wants to know if this is over with or what the issue was?   Pt can be reached at [phone]

## 2019-04-30 NOTE — Telephone Encounter (Signed)
Have her come into the lab to leave a UA. We will check for any blood in the urine

## 2019-04-30 NOTE — Telephone Encounter (Signed)
Orders placed.  Left message for patient to call back and make a lab appointment.

## 2019-05-01 NOTE — Telephone Encounter (Signed)
Left message for patient to call back  

## 2019-05-09 ENCOUNTER — Other Ambulatory Visit: Payer: Self-pay

## 2019-05-09 ENCOUNTER — Ambulatory Visit: Payer: Medicare HMO | Admitting: Orthopaedic Surgery

## 2019-05-09 DIAGNOSIS — M1711 Unilateral primary osteoarthritis, right knee: Secondary | ICD-10-CM | POA: Diagnosis not present

## 2019-05-09 DIAGNOSIS — M1712 Unilateral primary osteoarthritis, left knee: Secondary | ICD-10-CM

## 2019-05-09 DIAGNOSIS — M170 Bilateral primary osteoarthritis of knee: Secondary | ICD-10-CM

## 2019-05-09 NOTE — Progress Notes (Signed)
The patient comes in today with known severe end-stage arthritis of both her knees with the left worse than the right.  We just saw her a few weeks ago and play steroid injections in both her knees.  She is 84 years old.  She has significant varus malalignment of both knees.  She states the steroid injections worked for only a very short period time.  She does have a history of high blood pressure and epilepsy.  She wants to discuss treatment options.  Her left knee which is more painful has significant varus malalignment.  There is severe patellofemoral crepitation when I bend her knee back and forth.  I can flex her to just past 90 degrees.  There is a decent soft tissue envelope around her knees.  They are painful mainly medial joint line.  I did show her knee replacement model and discussed knee replacement surgery with her.  I have recommended Voltaren gel as an over-the-counter topical anti-inflammatory to try.  At this point I do not have any other treatment options.  Certainly physical therapy could help strengthen her knees but obviously the arthritis is quite severe that knee replacement is certainly an option.  If she does decide to consider this I would like her to come back and see [place] and bring a family member so I can go over x-rays with the family member and discussed in detail what the surgery involves if we decide this is certainly an option and she agrees with this as well.  All question concerns were answered addressed.  Follow-up as otherwise as needed.

## 2019-07-17 ENCOUNTER — Encounter: Payer: Self-pay | Admitting: Orthopaedic Surgery

## 2019-07-17 ENCOUNTER — Ambulatory Visit: Payer: Medicare HMO | Admitting: Orthopaedic Surgery

## 2019-07-17 ENCOUNTER — Other Ambulatory Visit: Payer: Self-pay

## 2019-07-17 DIAGNOSIS — M1712 Unilateral primary osteoarthritis, left knee: Secondary | ICD-10-CM

## 2019-07-17 NOTE — Progress Notes (Signed)
HPI: [name] returns today follow-up bilateral knee pain.  She is ambulating with a cane.  States that she is concerned about possibly following up on sleep taking to be helpful.  Is having pain in both knees left greater than right.  She would like to undergo surgery for the left knee but wants to undergo Covid vaccines prior to this.  She has failed conservative treatment which is included cortisone injections, exercise, time and an assistive device.  Review of systems: She denies any fevers chills shortness of breath chest pain.  Denies any ongoing infections.  History of epilepsy.  Physical exam: General well-developed well-nourished female no acute distress mood affect appropriate. Psych: Alert and oriented x3.  Left knee: She has full extension of the left knee with flexion to at least 90 degrees.  Ambulates with a cane in the right hand with slight antalgic gait.  Impression: Tricompartmental arthritis left knee  Plan: She will undergo a Covid vaccines and let us know when she wants to proceed with surgery.  Questions were encouraged and answered by Debra Carlson myself in regards to left total knee arthroplasty and the postoperative protocol.  Risk including but not limited to nerve vessel injury, DVT/PE, wound healing problems and prolonged pain discussed with her.  She is given Debra Carlson card and will call Debra Carlson whenever she would like to proceed with surgery.  We will see her back 2 weeks postop.

## 2020-02-06 ENCOUNTER — Other Ambulatory Visit: Payer: Self-pay

## 2020-02-06 ENCOUNTER — Ambulatory Visit: Payer: Medicare HMO

## 2020-04-08 ENCOUNTER — Ambulatory Visit: Payer: Medicare HMO

## 2020-04-23 ENCOUNTER — Ambulatory Visit (INDEPENDENT_AMBULATORY_CARE_PROVIDER_SITE_OTHER): Payer: Medicare HMO

## 2020-04-23 DIAGNOSIS — Z0000 Encounter for general adult medical examination without abnormal findings: Secondary | ICD-10-CM

## 2020-04-23 DIAGNOSIS — Z0100 Encounter for examination of eyes and vision without abnormal findings: Secondary | ICD-10-CM | POA: Diagnosis not present

## 2020-04-23 DIAGNOSIS — Z780 Asymptomatic menopausal state: Secondary | ICD-10-CM | POA: Diagnosis not present

## 2020-04-23 DIAGNOSIS — Z1231 Encounter for screening mammogram for malignant neoplasm of breast: Secondary | ICD-10-CM

## 2020-04-23 NOTE — Progress Notes (Signed)
Subjective:   Debra Carlson is a 85 y.o. female who presents for an Initial Medicare Annual Wellness Visit.   I connected with Debra AntisGladys Carlson today by telephone and verified that I am speaking with the correct person using two identifiers. Location patient: home Location provider: work Persons participating in the virtual visit: patient, provider.   I discussed the limitations, risks, security and privacy concerns of performing an evaluation and management service by telephone and the availability of in person appointments. I also discussed with the patient that there may be a patient responsible charge related to this service. The patient expressed understanding and verbally consented to this telephonic visit.    Interactive audio and video telecommunications were attempted between this provider and patient, however failed, due to patient having technical difficulties OR patient did not have access to video capability.  We continued and completed visit with audio only.     Review of Systems    N/a  Cardiac Risk Factors include: advanced age (>[id number], 60>65 women);hypertension;dyslipidemia     Objective:    Today's Vitals   There is no height or weight on file to calculate BMI.  Advanced Directives 04/23/2020 08/14/2014 07/12/2014 06/06/2014  Does Patient Have a Medical Advance Directive? Yes No No No  Type of Estate agentAdvance Directive Healthcare Power of TregoAttorney;Living will - - -  Does patient want to make changes to medical advance directive? No - Patient declined - - -  Copy of Healthcare Power of Attorney in Chart? No - copy requested - - -  Would patient like information on creating a medical advance directive? - - No - patient declined information Yes - Educational materials given    Current Medications (verified) Outpatient Encounter Medications as of 04/23/2020  Medication Sig  . lamoTRIgine (LAMICTAL) 25 MG tablet Take 2 tablets (50 mg total) by mouth daily.  . Multiple  Vitamin (MULTIVITAMIN) tablet Take 1 tablet by mouth daily.  Marland Kitchen. trolamine salicylate (ASPERCREME) 10 % cream Apply 1 application topically as needed (for knees).  Marland Kitchen. aspirin 81 MG tablet Take 81 mg by mouth daily.   (Patient not taking: Reported on 04/23/2020)  . meclizine (ANTIVERT) 25 MG tablet Take 1 tablet (25 mg total) by mouth every 4 (four) hours as needed for dizziness. (Patient not taking: Reported on 04/23/2020)  . sulfamethoxazole-trimethoprim (BACTRIM DS) 800-160 MG tablet Take 1 tablet by mouth 2 (two) times daily.   No facility-administered encounter medications on file as of 04/23/2020.    Allergies (verified) Patient has no known allergies.   History: Past Medical History:  Diagnosis Date  . Cataract   . Hypertension   . Osteoarthritis   . Seizure disorder [facility](HCC)    single seizure in 2005, sees Dr. Kelli HopeMichael Reynolds  . Seizures (HCC)    last seizure in 2011   Past Surgical History:  Procedure Laterality Date  . CESAREAN SECTION    . TONSILLECTOMY     Family History  Problem Relation Age of Onset  . Colon cancer Mother   . Breast cancer Maternal Aunt [id number]   Social History   Socioeconomic History  . Marital status: Married    Spouse name: Not on file  . Number of children: 1  . Years of education: 12+  . Highest education level: Not on file  Occupational History  . Occupation: Retired   Tobacco Use  . Smoking status: Never Smoker  . Smokeless tobacco: Never Used  Substance and Sexual Activity  . Alcohol use: No  Alcohol/week: 0.0 standard drinks  . Drug use: No  . Sexual activity: Not on file  Other Topics Concern  . Not on file  Social History Narrative   Married    Never Smoked   Alcohol use- no   Drug use- no         Social Determinants of Corporate investment banker Strain: Low Risk   . Difficulty of Paying Living Expenses: Not hard at all  Food Insecurity: No Food Insecurity  . Worried About Programme researcher, broadcasting/film/video in the Last Year: Never true   . Ran Out of Food in the Last Year: Never true  Transportation Needs: No Transportation Needs  . Lack of Transportation (Medical): No  . Lack of Transportation (Non-Medical): No  Physical Activity: Inactive  . Days of Exercise per Week: 0 days  . Minutes of Exercise per Session: 0 min  Stress: No Stress Concern Present  . Feeling of Stress : Not at all  Social Connections: Moderately Integrated  . Frequency of Communication with Friends and Family: More than three times a week  . Frequency of Social Gatherings with Friends and Family: More than three times a week  . Attends Religious Services: 1 to 4 times per year  . Active Member of Clubs or Organizations: No  . Attends Banker Meetings: Never  . Marital Status: Married    Tobacco Counseling Counseling given: Not Answered   Clinical Intake:  Pre-visit preparation completed: Yes  Pain : No/denies pain     Nutritional Risks: None Diabetes: No  How often do you need to have someone help you when you read instructions, pamphlets, or other written materials from your doctor or pharmacy?: 1 - Never What is the last grade level you completed in school?: college  Diabetic?no  Interpreter Needed?: No  Information entered by :: Debra Prestage,LPN   Activities of Daily Living In your present state of health, do you have any difficulty performing the following activities: 04/23/2020  Hearing? N  Vision? N  Difficulty concentrating or making decisions? N  Walking or climbing stairs? N  Dressing or bathing? N  Doing errands, shopping? N  Preparing Food and eating ? N  Using the Toilet? N  In the past six months, have you accidently leaked urine? N  Do you have problems with loss of bowel control? N  Managing your Medications? N  Managing your Finances? N  Housekeeping or managing your Housekeeping? N  Some recent data might be hidden    Patient Care Team: Nelwyn Salisbury, MD as PCP - General  Indicate any  recent Medical Services you may have received from other than [facility] providers in the past year (date may be approximate).     Assessment:   This is a routine wellness examination for Debra Carlson.  Hearing/Vision screen  Hearing Screening   125Hz  250Hz  500Hz  1000Hz  2000Hz  3000Hz  4000Hz  6000Hz  8000Hz   Right ear:           Left ear:           Vision Screening Comments: Pt declines eye exam due to ambulation issues   Dietary issues and exercise activities discussed: Current Exercise Habits: The patient does not participate in regular exercise at present, Exercise limited by: orthopedic condition(s)  Goals    . DIET - INCREASE WATER INTAKE      Depression Screen PHQ 2/9 Scores 04/23/2020 04/23/2020 12/25/2015 07/29/2014  PHQ - 2 Score 0 0 0 0    Fall  Risk Fall Risk  04/23/2020 12/25/2015 07/29/2014  Falls in the past year? 0 No No  Number falls in past yr: 0 - -  Injury with Fall? 0 - -  Follow up Falls evaluation completed - -    FALL RISK PREVENTION PERTAINING TO THE HOME:  Any stairs in or around the home? Yes  If so, are there any without handrails? Yes  Home free of loose throw rugs in walkways, pet beds, electrical cords, etc? Yes  Adequate lighting in your home to reduce risk of falls? Yes   ASSISTIVE DEVICES UTILIZED TO PREVENT FALLS:  Life alert? No  Use of a cane, walker or w/c? Yes  Grab bars in the bathroom? Yes  Shower chair or bench in shower? Yes  Elevated toilet seat or a handicapped toilet? Yes    Cognitive Function:     Normal cognitive status assessed by direct observation by this Nurse Health Advisor. No abnormalities found.      Immunizations Immunization History  Administered Date(s) Administered  . Tdap 05/20/2017    TDAP status: Up to date  Flu Vaccine status: Due, Education has been provided regarding the importance of this vaccine. Advised may receive this vaccine at local pharmacy or Health Dept. Aware to provide a copy of the vaccination  record if obtained from local pharmacy or Health Dept. Verbalized acceptance and understanding.  Pneumococcal vaccine status: Due, Education has been provided regarding the importance of this vaccine. Advised may receive this vaccine at local pharmacy or Health Dept. Aware to provide a copy of the vaccination record if obtained from local pharmacy or Health Dept. Verbalized acceptance and understanding.  Covid-19 vaccine status: Declined, Education has been provided regarding the importance of this vaccine but patient still declined. Advised may receive this vaccine at local pharmacy or Health Dept.or vaccine clinic. Aware to provide a copy of the vaccination record if obtained from local pharmacy or Health Dept. Verbalized acceptance and understanding.  Qualifies for Shingles Vaccine? Yes   Zostavax completed Yes   Shingrix Completed?: No.    Education has been provided regarding the importance of this vaccine. Patient has been advised to call insurance company to determine out of pocket expense if they have not yet received this vaccine. Advised may also receive vaccine at local pharmacy or Health Dept. Verbalized acceptance and understanding.  Screening Tests Health Maintenance  Topic Date Due  . COVID-19 Vaccine (1) Never done  . DEXA SCAN  Never done  . PNA vac Low Risk Adult (1 of 2 - PCV13) Never done  . INFLUENZA VACCINE  02/12/2034 (Originally 08/11/2020)  . TETANUS/TDAP  05/21/2027  . HPV VACCINES  Aged Out    Health Maintenance  Health Maintenance Due  Topic Date Due  . COVID-19 Vaccine (1) Never done  . DEXA SCAN  Never done  . PNA vac Low Risk Adult (1 of 2 - PCV13) Never done    Colorectal cancer screening: No longer required.   Mammogram status: Ordered 04/22/2020. Pt provided with contact info and advised to call to schedule appt.   Bone Density status: Ordered 04/22/2020. Pt provided with contact info and advised to call to schedule appt.  Lung Cancer Screening:  (Low Dose CT Chest recommended if Age 15-80 years, 30 pack-year currently smoking OR have quit w/in 15years.) does not qualify.   Lung Cancer Screening Referral: n/a   Additional Screening:  Hepatitis C Screening: does not qualify  Vision Screening: Recommended annual ophthalmology exams for early detection  of glaucoma and other disorders of the eye. Is the patient up to date with their annual eye exam?  No  Who is the provider or what is the name of the office in which the patient attends annual eye exams? referral completed 04/22/2020 If pt is not established with a provider, would they like to be referred to a provider to establish care? Yes .   Dental Screening: Recommended annual dental exams for proper oral hygiene  Community Resource Referral / Chronic Care Management: CRR required this visit?  No   CCM required this visit?  No      Plan:     I have personally reviewed and noted the following in the patient's chart:   . Medical and social history . Use of alcohol, tobacco or illicit drugs  . Current medications and supplements . Functional ability and status . Nutritional status . Physical activity . Advanced directives . List of other physicians . Hospitalizations, surgeries, and ER visits in previous 12 months . Vitals . Screenings to include cognitive, depression, and falls . Referrals and appointments  In addition, I have reviewed and discussed with patient certain preventive protocols, quality metrics, and best practice recommendations. A written personalized care plan for preventive services as well as general preventive health recommendations were provided to patient.     March Rummage, LPN   1/75/1025   Nurse Notes: none

## 2020-04-23 NOTE — Patient Instructions (Signed)
Debra Carlson , Thank you for taking time to come for your Medicare Wellness Visit. I appreciate your ongoing commitment to your health goals. Please review the following plan we discussed and let me know if I can assist you in the future.   Screening recommendations/referrals: Colonoscopy: No longer needed  Mammogram: referral 04/22/2020 Bone Density: referral 04/22/2020 Recommended yearly ophthalmology/optometry visit for glaucoma screening and checkup Recommended yearly dental visit for hygiene and checkup  Vaccinations: Influenza vaccine: Due Fall 2022  Pneumococcal vaccine: will obtain local pharmacy  Tdap vaccine: current  Shingles vaccine: will obtain local pharmacy     Advanced directives: will provide copies   Conditions/risks identified: needs annual eye exam referral sent 04/22/2020  Next appointment: none    Preventive Care 65 Years and Older, Female Preventive care refers to lifestyle choices and visits with your health care provider that can promote health and wellness. What does preventive care include?  A yearly physical exam. This is also called an annual well check.  Dental exams once or twice a year.  Routine eye exams. Ask your health care provider how often you should have your eyes checked.  Personal lifestyle choices, including:  Daily care of your teeth and gums.  Regular physical activity.  Eating a healthy diet.  Avoiding tobacco and drug use.  Limiting alcohol use.  Practicing safe sex.  Taking low-dose aspirin every day.  Taking vitamin and mineral supplements as recommended by your health care provider. What happens during an annual well check? The services and screenings done by your health care provider during your annual well check will depend on your age, overall health, lifestyle risk factors, and family history of disease. Counseling  Your health care provider may ask you questions about your:  Alcohol use.  Tobacco use.  Drug  use.  Emotional well-being.  Home and relationship well-being.  Sexual activity.  Eating habits.  History of falls.  Memory and ability to understand (cognition).  Work and work Astronomer.  Reproductive health. Screening  You may have the following tests or measurements:  Height, weight, and BMI.  Blood pressure.  Lipid and cholesterol levels. These may be checked every 5 years, or more frequently if you are over 4 years old.  Skin check.  Lung cancer screening. You may have this screening every year starting at age 48 if you have a 30-pack-year history of smoking and currently smoke or have quit within the past 15 years.  Fecal occult blood test (FOBT) of the stool. You may have this test every year starting at age 54.  Flexible sigmoidoscopy or colonoscopy. You may have a sigmoidoscopy every 5 years or a colonoscopy every 10 years starting at age 89.  Hepatitis C blood test.  Hepatitis B blood test.  Sexually transmitted disease (STD) testing.  Diabetes screening. This is done by checking your blood sugar (glucose) after you have not eaten for a while (fasting). You may have this done every 1-3 years.  Bone density scan. This is done to screen for osteoporosis. You may have this done starting at age 28.  Mammogram. This may be done every 1-2 years. Talk to your health care provider about how often you should have regular mammograms. Talk with your health care provider about your test results, treatment options, and if necessary, the need for more tests. Vaccines  Your health care provider may recommend certain vaccines, such as:  Influenza vaccine. This is recommended every year.  Tetanus, diphtheria, and acellular pertussis (Tdap, Td)  vaccine. You may need a Td booster every 10 years.  Zoster vaccine. You may need this after age 35.  Pneumococcal 13-valent conjugate (PCV13) vaccine. One dose is recommended after age 32.  Pneumococcal polysaccharide  (PPSV23) vaccine. One dose is recommended after age 60. Talk to your health care provider about which screenings and vaccines you need and how often you need them. This information is not intended to replace advice given to you by your health care provider. Make sure you discuss any questions you have with your health care provider. Document Released: 01/24/2015 Document Revised: 09/17/2015 Document Reviewed: 10/29/2014 Elsevier Interactive Patient Education  2017 [employer] Prevention in the Home Falls can cause injuries. They can happen to people of all ages. There are many things you can do to make your home safe and to help prevent falls. What can I do on the outside of my home?  Regularly fix the edges of walkways and driveways and fix any cracks.  Remove anything that might make you trip as you walk through a door, such as a raised step or threshold.  Trim any bushes or trees on the path to your home.  Use bright outdoor lighting.  Clear any walking paths of anything that might make someone trip, such as rocks or tools.  Regularly check to see if handrails are loose or broken. Make sure that both sides of any steps have handrails.  Any raised decks and porches should have guardrails on the edges.  Have any leaves, snow, or ice cleared regularly.  Use sand or salt on walking paths during winter.  Clean up any spills in your garage right away. This includes oil or grease spills. What can I do in the bathroom?  Use night lights.  Install grab bars by the toilet and in the tub and shower. Do not use towel bars as grab bars.  Use non-skid mats or decals in the tub or shower.  If you need to sit down in the shower, use a plastic, non-slip stool.  Keep the floor dry. Clean up any water that spills on the floor as soon as it happens.  Remove soap buildup in the tub or shower regularly.  Attach bath mats securely with double-sided non-slip rug tape.  Do not have  throw rugs and other things on the floor that can make you trip. What can I do in the bedroom?  Use night lights.  Make sure that you have a light by your bed that is easy to reach.  Do not use any sheets or blankets that are too big for your bed. They should not hang down onto the floor.  Have a firm chair that has side arms. You can use this for support while you get dressed.  Do not have throw rugs and other things on the floor that can make you trip. What can I do in the kitchen?  Clean up any spills right away.  Avoid walking on wet floors.  Keep items that you use a lot in easy-to-reach places.  If you need to reach something above you, use a strong step stool that has a grab bar.  Keep electrical cords out of the way.  Do not use floor polish or wax that makes floors slippery. If you must use wax, use non-skid floor wax.  Do not have throw rugs and other things on the floor that can make you trip. What can I do with my stairs?  Do not leave  any items on the stairs.  Make sure that there are handrails on both sides of the stairs and use them. Fix handrails that are broken or loose. Make sure that handrails are as long as the stairways.  Check any carpeting to make sure that it is firmly attached to the stairs. Fix any carpet that is loose or worn.  Avoid having throw rugs at the top or bottom of the stairs. If you do have throw rugs, attach them to the floor with carpet tape.  Make sure that you have a light switch at the top of the stairs and the bottom of the stairs. If you do not have them, ask someone to add them for you. What else can I do to help prevent falls?  Wear shoes that:  Do not have high heels.  Have rubber bottoms.  Are comfortable and fit you well.  Are closed at the toe. Do not wear sandals.  If you use a stepladder:  Make sure that it is fully opened. Do not climb a closed stepladder.  Make sure that both sides of the stepladder are  locked into place.  Ask someone to hold it for you, if possible.  Clearly mark and make sure that you can see:  Any grab bars or handrails.  First and last steps.  Where the edge of each step is.  Use tools that help you move around (mobility aids) if they are needed. These include:  Canes.  Walkers.  Scooters.  Crutches.  Turn on the lights when you go into a dark area. Replace any light bulbs as soon as they burn out.  Set up your furniture so you have a clear path. Avoid moving your furniture around.  If any of your floors are uneven, fix them.  If there are any pets around you, be aware of where they are.  Review your medicines with your doctor. Some medicines can make you feel dizzy. This can increase your chance of falling. Ask your doctor what other things that you can do to help prevent falls. This information is not intended to replace advice given to you by your health care provider. Make sure you discuss any questions you have with your health care provider. Document Released: 10/24/2008 Document Revised: 06/05/2015 Document Reviewed: 02/01/2014 Elsevier Interactive Patient Education  2017 [employer].

## 2020-04-25 ENCOUNTER — Other Ambulatory Visit: Payer: Self-pay | Admitting: Family Medicine

## 2020-05-12 ENCOUNTER — Other Ambulatory Visit: Payer: Self-pay | Admitting: Family Medicine

## 2020-05-15 ENCOUNTER — Other Ambulatory Visit: Payer: Self-pay

## 2020-05-15 ENCOUNTER — Ambulatory Visit (INDEPENDENT_AMBULATORY_CARE_PROVIDER_SITE_OTHER): Payer: Medicare HMO | Admitting: Family Medicine

## 2020-05-15 ENCOUNTER — Encounter: Payer: Self-pay | Admitting: Family Medicine

## 2020-05-15 VITALS — BP 150/90 | HR 100 | Temp 97.7°F | Wt 194.2 lb

## 2020-05-15 DIAGNOSIS — R600 Localized edema: Secondary | ICD-10-CM | POA: Diagnosis not present

## 2020-05-15 DIAGNOSIS — R739 Hyperglycemia, unspecified: Secondary | ICD-10-CM | POA: Diagnosis not present

## 2020-05-15 DIAGNOSIS — I10 Essential (primary) hypertension: Secondary | ICD-10-CM | POA: Diagnosis not present

## 2020-05-15 LAB — LIPID PANEL
Cholesterol: 166 mg/dL (ref 0–200)
HDL: 49.8 mg/dL (ref >39.00)
LDL Cholesterol: 105 mg/dL — ABNORMAL HIGH (ref 0–99)
NonHDL: 116.37
Total CHOL/HDL Ratio: 3
Triglycerides: 56 mg/dL (ref 0.0–149.0)
VLDL: 11.2 mg/dL (ref 0.0–40.0)

## 2020-05-15 LAB — HEPATIC FUNCTION PANEL
ALT: 11 U/L (ref 0–35)
AST: 12 U/L (ref 0–37)
Albumin: 4.1 g/dL (ref 3.5–5.2)
Alkaline Phosphatase: 89 U/L (ref 39–117)
Bilirubin, Direct: 0.2 mg/dL (ref 0.0–0.3)
Total Bilirubin: 0.8 mg/dL (ref 0.2–1.2)
Total Protein: 6.9 g/dL (ref 6.0–8.3)

## 2020-05-15 LAB — BASIC METABOLIC PANEL
BUN: 15 mg/dL (ref 6–23)
CO2: 29 mEq/L (ref 19–32)
Calcium: 9.7 mg/dL (ref 8.4–10.5)
Chloride: 103 mEq/L (ref 96–112)
Creatinine, Ser: 0.81 mg/dL (ref 0.40–1.20)
GFR: 67.31 mL/min (ref >60.00)
Glucose, Bld: 149 mg/dL — ABNORMAL HIGH (ref 70–99)
Potassium: 4.1 mEq/L (ref 3.5–5.1)
Sodium: 140 mEq/L (ref 135–145)

## 2020-05-15 LAB — CBC WITH DIFFERENTIAL/PLATELET
Basophils Absolute: 0 10*3/uL (ref 0.0–0.1)
Basophils Relative: 0.8 % (ref 0.0–3.0)
Eosinophils Absolute: 0.1 10*3/uL (ref 0.0–0.7)
Eosinophils Relative: 2.7 % (ref 0.0–5.0)
HCT: 42.5 % (ref 36.0–46.0)
Hemoglobin: 14.2 g/dL (ref 12.0–15.0)
Lymphocytes Relative: 30.1 % (ref 12.0–46.0)
Lymphs Abs: 1.7 10*3/uL (ref 0.7–4.0)
MCHC: 33.3 g/dL (ref 30.0–36.0)
MCV: 91 fl (ref 78.0–100.0)
Monocytes Absolute: 0.6 10*3/uL (ref 0.1–1.0)
Monocytes Relative: 10.9 % (ref 3.0–12.0)
Neutro Abs: 3.1 10*3/uL (ref 1.4–7.7)
Neutrophils Relative %: 55.5 % (ref 43.0–77.0)
Platelets: 206 10*3/uL (ref 150.0–400.0)
RBC: 4.68 Mil/uL (ref 3.87–5.11)
RDW: 12.6 % (ref 11.5–15.5)
WBC: 5.6 10*3/uL (ref 4.0–10.5)

## 2020-05-15 LAB — T4, FREE: Free T4: 1.03 ng/dL (ref 0.60–1.60)

## 2020-05-15 LAB — T3, FREE: T3, Free: 2.8 pg/mL (ref 2.3–4.2)

## 2020-05-15 LAB — HEMOGLOBIN A1C: Hgb A1c MFr Bld: 6.4 % (ref 4.6–6.5)

## 2020-05-15 LAB — TSH: TSH: 3.43 u[IU]/mL (ref 0.35–4.50)

## 2020-05-15 MED ORDER — FUROSEMIDE 20 MG PO TABS: 20.0000 mg | ORAL_TABLET | Freq: Every day | ORAL | 3 refills | Status: DC

## 2020-05-15 NOTE — Progress Notes (Signed)
   Subjective:    Patient ID: Debra Carlson, female    DOB: [birth year], 85 y.o.   MRN: [id number]  HPI Here for increased swelling in both lower legs. She has had this for years, but lately it has become more pronounced. No chest pain or SOB. Her weight is stable. Her BP has been borderline high.    Review of Systems  Constitutional: Negative.   Respiratory: Negative.   Cardiovascular: Positive for leg swelling. Negative for chest pain and palpitations.       Objective:   Physical Exam Constitutional:      Appearance: Normal appearance.  Cardiovascular:     Rate and Rhythm: Normal rate and regular rhythm.     Pulses: Normal pulses.     Heart sounds: Normal heart sounds.  Pulmonary:     Effort: Pulmonary effort is normal.     Breath sounds: Normal breath sounds.  Musculoskeletal:     Comments: 2+ edema in both lower legs and ankles   Neurological:     Mental Status: She is alert.           Assessment & Plan:  For the leg edema, she will start on Lasix 20 mg daily. She will monitor her sodium intake. Get labs today, including renal function. Her BP is borderline high, and we will watch this closely.  Gershon Crane, MD

## 2020-07-04 ENCOUNTER — Other Ambulatory Visit: Payer: Self-pay

## 2020-07-07 ENCOUNTER — Ambulatory Visit: Payer: Medicare HMO | Admitting: Family Medicine

## 2020-07-08 ENCOUNTER — Other Ambulatory Visit: Payer: Self-pay | Admitting: Family Medicine

## 2020-07-09 NOTE — Telephone Encounter (Signed)
Pt LOV was 05/15/2020 Last refill was done 05/12/2020 for 180 with no refill Please advise

## 2020-08-10 ENCOUNTER — Other Ambulatory Visit: Payer: Self-pay | Admitting: Family Medicine

## 2020-08-11 ENCOUNTER — Other Ambulatory Visit: Payer: Self-pay

## 2020-08-11 NOTE — Telephone Encounter (Signed)
Last refill- 05/15/20--30 tabs, 3 refills Last office visit seen for edema- 05/15/20  Appointment scheduled for 08/12/20,  can this patient receive a refill, or eval then refill?

## 2020-08-12 ENCOUNTER — Encounter: Payer: Self-pay | Admitting: Family Medicine

## 2020-08-12 ENCOUNTER — Ambulatory Visit (INDEPENDENT_AMBULATORY_CARE_PROVIDER_SITE_OTHER): Payer: Medicare HMO | Admitting: Family Medicine

## 2020-08-12 VITALS — BP 132/86 | HR 85 | Temp 98.5°F | Wt 191.0 lb

## 2020-08-12 DIAGNOSIS — N950 Postmenopausal bleeding: Secondary | ICD-10-CM

## 2020-08-12 DIAGNOSIS — H6983 Other specified disorders of Eustachian tube, bilateral: Secondary | ICD-10-CM

## 2020-08-12 DIAGNOSIS — R319 Hematuria, unspecified: Secondary | ICD-10-CM

## 2020-08-12 LAB — POC URINALSYSI DIPSTICK (AUTOMATED)
Bilirubin, UA: NEGATIVE
Blood, UA: NEGATIVE
Glucose, UA: NEGATIVE
Ketones, UA: NEGATIVE
Leukocytes, UA: NEGATIVE
Nitrite, UA: POSITIVE
Protein, UA: POSITIVE — AB
Spec Grav, UA: 1.02 (ref 1.010–1.025)
Urobilinogen, UA: 0.2 E.U./dL
pH, UA: 6 (ref 5.0–8.0)

## 2020-08-12 NOTE — Progress Notes (Signed)
   Subjective:    Patient ID: Debra Carlson, female    DOB: 02-21-1935, 85 y.o.   MRN: [id number]  HPI Here for several issues. First she has noticed a small amount of bleeding into her underwear off and on for about a year. No abdominal pain. No problems with urinating or with BMs. She has not seen any blood in her urine or stool. Also she describes several months of decreased hearing and muffled hearing in both ears. No ear pain. No sinus congestion.    Review of Systems  Constitutional: Negative.   HENT:  Positive for hearing loss. Negative for congestion, ear pain, postnasal drip, sinus pressure and sore throat.   Eyes: Negative.   Respiratory: Negative.    Genitourinary:  Positive for vaginal bleeding.      Objective:   Physical Exam Constitutional:      Appearance: Normal appearance.  HENT:     Right Ear: Tympanic membrane, ear canal and external ear normal.     Left Ear: Tympanic membrane, ear canal and external ear normal.     Nose: Nose normal.     Mouth/Throat:     Pharynx: Oropharynx is clear.  Eyes:     Conjunctiva/sclera: Conjunctivae normal.  Cardiovascular:     Rate and Rhythm: Normal rate and regular rhythm.     Pulses: Normal pulses.     Heart sounds: Normal heart sounds.  Pulmonary:     Effort: Pulmonary effort is normal.     Breath sounds: Normal breath sounds.  Lymphadenopathy:     Cervical: No cervical adenopathy.  Neurological:     Mental Status: She is alert.          Assessment & Plan:  She seems to be having some postmenopausal bleeding, so we will refer her to GYN to evaluate. Her hearing issue seems to be from eustachian tube dysfunction, so she can try taking Mucinex BID for a few days. We spent 35 minutes reviewing records and discussing these issues.   Gershon Crane, MD

## 2020-08-14 LAB — URINE CULTURE
MICRO NUMBER:: 12191094
SPECIMEN QUALITY:: ADEQUATE

## 2020-08-19 ENCOUNTER — Other Ambulatory Visit: Payer: Self-pay

## 2020-08-19 MED ORDER — CIPRO 500 MG PO TABS: 500.0000 mg | ORAL_TABLET | Freq: Two times a day (BID) | ORAL | 0 refills | Status: AC

## 2020-08-28 ENCOUNTER — Ambulatory Visit: Payer: Medicare HMO | Admitting: Obstetrics and Gynecology

## 2020-09-01 ENCOUNTER — Ambulatory Visit: Payer: Medicare HMO | Admitting: Obstetrics and Gynecology

## 2020-09-16 ENCOUNTER — Other Ambulatory Visit: Payer: Self-pay

## 2020-09-16 ENCOUNTER — Ambulatory Visit: Payer: Medicare HMO | Admitting: Obstetrics and Gynecology

## 2020-09-16 ENCOUNTER — Other Ambulatory Visit: Payer: Self-pay | Admitting: *Deleted

## 2020-09-16 ENCOUNTER — Encounter: Payer: Self-pay | Admitting: Obstetrics and Gynecology

## 2020-09-16 ENCOUNTER — Other Ambulatory Visit (HOSPITAL_COMMUNITY)
Admission: RE | Admit: 2020-09-16 | Discharge: 2020-09-16 | Disposition: A | Discharge status: Home / Self Care | Payer: Medicare HMO | Source: Ambulatory Visit | Attending: Obstetrics and Gynecology | Admitting: Obstetrics and Gynecology

## 2020-09-16 VITALS — BP 120/76 | HR 96 | Resp 16 | Ht 61.0 in | Wt 192.0 lb

## 2020-09-16 DIAGNOSIS — N950 Postmenopausal bleeding: Secondary | ICD-10-CM | POA: Insufficient documentation

## 2020-09-16 DIAGNOSIS — Z124 Encounter for screening for malignant neoplasm of cervix: Secondary | ICD-10-CM | POA: Insufficient documentation

## 2020-09-16 DIAGNOSIS — N882 Stricture and stenosis of cervix uteri: Secondary | ICD-10-CM | POA: Diagnosis not present

## 2020-09-16 MED ORDER — MISOPROSTOL 200 MCG PO TABS: ORAL_TABLET | ORAL | 0 refills | Status: DC

## 2020-09-16 NOTE — Progress Notes (Signed)
85 y.o. G1P1001 Married Black or [place] American Not Hispanic or Latino female here for evaluation of vaginal bleeding. Her first episode of bleeding was a year ago, it was heavy and lasted for 5-6 days. She has had 2 episodes of light bleeding for 1-2 days since last year. The last episode was last month. No pain. Not sexually active for 12 years.   Her husband has DM and prostate cancer. His diabetes is bad, he may need to have his toes amputated. She has been under a lot of stress.     No LMP recorded. Patient is postmenopausal.          Sexually active: No.  The current method of family planning is post menopausal status.    Exercising: No.   exercise Smoker:  no  Health Maintenance: Pap:  long time ago History of abnormal Pap:  no MMG:  02-19-19 category b density birads 1:neg BMD:   none Colonoscopy: not done TDaP:  2019 Gardasil: n/a   reports that she has never smoked. She has never used smokeless tobacco. She reports that she does not drink alcohol and does not use drugs.  Past Medical History:  Diagnosis Date   Cataract    Hypertension    Osteoarthritis    Seizure disorder (HCC)    single seizure in 2005, sees Dr. Kelli Hope   Seizures [facility])    last seizure in 2011    Past Surgical History:  Procedure Laterality Date   CESAREAN SECTION     TONSILLECTOMY      Current Outpatient Medications  Medication Sig Dispense Refill   furosemide (LASIX) 20 MG tablet TAKE 1 TABLET BY MOUTH EVERY DAY 90 tablet 1   lamoTRIgine (LAMICTAL) 25 MG tablet Take 2 tablets (50 mg total) by mouth daily. 60 tablet 5   meclizine (ANTIVERT) 25 MG tablet Take 1 tablet (25 mg total) by mouth every 4 (four) hours as needed for dizziness. 60 tablet 2   Multiple Vitamin (MULTIVITAMIN) tablet Take 1 tablet by mouth daily.     naproxen sodium (ALEVE) 220 MG tablet Take 220 mg by mouth.     trolamine salicylate (ASPERCREME) 10 % cream Apply 1 application topically as needed (for knees).      No current facility-administered medications for this visit.    Family History  Problem Relation Age of Onset   Colon cancer Mother    Heart attack Father    Hypertension Maternal Aunt    Breast cancer Maternal Aunt 43    Review of Systems  Exam:   BP 120/76   Pulse 96   Resp 16   Ht 5\' 1"  (1.549 m)   Wt 192 lb (87.1 kg)   BMI 36.28 kg/m   Weight change: @WEIGHTCHANGE @ Height:   Height: 5\' 1"  (154.9 cm)  Ht Readings from Last 3 Encounters:  09/16/20 5\' 1"  (1.549 m)  04/16/19 5\' 2"  (1.575 m)  04/11/19 5\' 2"  (1.575 m)    General appearance: alert, cooperative and appears stated age Abdomen: soft, non-tender; non distended,  no masses,  no organomegaly   Pelvic: External genitalia:  no lesions              Urethra:  normal appearing urethra with no masses, tenderness or lesions              Bartholins and Skenes: normal                 Vagina: normal appearing vagina with  normal color and discharge, no lesions              Cervix: no lesions and stenotic               Bimanual Exam:  Uterus:   no masses or tenderness, exam limited by BMI              Adnexa: no mass, fullness, tenderness                The risks of endometrial biopsy were reviewed and a consent was obtained.  A speculum was placed in the vagina and the cervix was cleansed with Hibiclens.  A tenaculum was placed on the cervix. The cervix was stenotic. I could get through the external os, but could not dilate the internal cervical os with the smallest mini-dilators. The tenaculum was removed. The tenaculum site at 10 o'clock was bleeding. Hemostasis was achieved with silver nitrate and pressure. The speculum was removed. There were no complications.    Cornelia Copa, CMA chaperoned for the exam.  1. Postmenopausal bleeding Unable to complete endometrial biopsy today secondary to cervical stenosis. Will pretreat with cytotec and have her return for a biopsy under ultrasound guidance.  - Cytology - PAP -  US PELVIS TRANSVAGINAL NON-OB (TV ONLY); Future - Endometrial biopsy; Future  2. Cervical stenosis (uterine cervix) See above - US PELVIS TRANSVAGINAL NON-OB (TV ONLY); Future - misoprostol (CYTOTEC) 200 MCG tablet; Place 2 tablets vaginally 6-12 hours prior to your office visit  Dispense: 2 tablet; Refill: 0  3. Screening for cervical cancer - Cytology - PAP  CC: Dr Clent Ridges

## 2020-09-18 LAB — CYTOLOGY - PAP
Adequacy: ABSENT
Diagnosis: NEGATIVE

## 2020-10-23 ENCOUNTER — Other Ambulatory Visit: Payer: Medicare HMO

## 2020-10-23 ENCOUNTER — Other Ambulatory Visit: Payer: Medicare HMO | Admitting: Obstetrics and Gynecology

## 2020-11-12 ENCOUNTER — Inpatient Hospital Stay: Admission: RE | Admit: 2020-11-12 | Payer: Medicare HMO | Source: Ambulatory Visit

## 2020-11-28 ENCOUNTER — Other Ambulatory Visit: Payer: Self-pay | Admitting: Family Medicine

## 2020-12-11 ENCOUNTER — Other Ambulatory Visit: Payer: Medicare HMO | Admitting: Obstetrics and Gynecology

## 2020-12-11 ENCOUNTER — Other Ambulatory Visit: Payer: Medicare HMO

## 2021-04-24 ENCOUNTER — Telehealth: Payer: Self-pay

## 2021-04-24 ENCOUNTER — Ambulatory Visit: Payer: Medicare HMO

## 2021-04-24 DIAGNOSIS — Z0000 Encounter for general adult medical examination without abnormal findings: Secondary | ICD-10-CM | POA: Diagnosis not present

## 2021-04-24 NOTE — Telephone Encounter (Signed)
Contacted patient on preferred number listed in notes for scheduled AWV. Patient stated unable to complete encounter today,Husband in hospital. Will call to reschedule. ?

## 2021-04-24 NOTE — Progress Notes (Signed)
? ?Subjective:  ? Debra Carlson is a 86 y.o. female who presents for Medicare Annual (Subsequent) preventive examination. ? ?Review of Systems    ? ? ?   ?Objective:  ?  ?There were no vitals filed for this visit. ?There is no height or weight on file to calculate BMI. ? ? ?  04/23/2020  ?  3:38 PM 08/14/2014  ?  1:18 PM 07/12/2014  ?  8:28 PM 06/06/2014  ?  9:11 AM  ?Advanced Directives  ?Does Patient Have a Medical Advance Directive? Yes No No No  ?Type of Estate agent of [place];Living will     ?Does patient want to make changes to medical advance directive? No - Patient declined     ?Copy of Healthcare Power of Attorney in Chart? No - copy requested     ?Would patient like information on creating a medical advance directive?   No - patient declined information Yes English as a second language teacher given  ? ? ?Current Medications (verified) ?Outpatient Encounter Medications as of 04/24/2021  ?Medication Sig  ? furosemide (LASIX) 20 MG tablet TAKE 1 TABLET BY MOUTH EVERY DAY  ? lamoTRIgine (LAMICTAL) 25 MG tablet TAKE 2 TABLETS BY MOUTH EVERY DAY  ? meclizine (ANTIVERT) 25 MG tablet Take 1 tablet (25 mg total) by mouth every 4 (four) hours as needed for dizziness.  ? misoprostol (CYTOTEC) 200 MCG tablet Place 2 tablets vaginally 6-12 hours prior to your office visit  ? Multiple Vitamin (MULTIVITAMIN) tablet Take 1 tablet by mouth daily.  ? naproxen sodium (ALEVE) 220 MG tablet Take 220 mg by mouth.  ? trolamine salicylate (ASPERCREME) 10 % cream Apply 1 application topically as needed (for knees).  ? ?No facility-administered encounter medications on file as of 04/24/2021.  ? ? ?Allergies (verified) ?Patient has no known allergies.  ? ?History: ?Past Medical History:  ?Diagnosis Date  ? Cataract   ? Hypertension   ? Osteoarthritis   ? Seizure disorder (HCC)   ? single seizure in 2005, sees Dr. Kelli Hope  ? Seizures (HCC)   ? last seizure in 2011  ? ?Past Surgical History:  ?Procedure Laterality  Date  ? CESAREAN SECTION    ? TONSILLECTOMY    ? ?Family History  ?Problem Relation Age of Onset  ? Colon cancer Mother   ? Heart attack Father   ? Hypertension Maternal Aunt   ? Breast cancer Maternal Aunt 11  ? ?Social History  ? ?Socioeconomic History  ? Marital status: Married  ?  Spouse name: Not on file  ? Number of children: 1  ? Years of education: 12+  ? Highest education level: Not on file  ?Occupational History  ? Occupation: Retired   ?Tobacco Use  ? Smoking status: Never  ? Smokeless tobacco: Never  ?Substance and Sexual Activity  ? Alcohol use: No  ?  Alcohol/week: 0.0 standard drinks  ? Drug use: No  ? Sexual activity: Not Currently  ?  Partners: Male  ?  Birth control/protection: Post-menopausal  ?Other Topics Concern  ? Not on file  ?Social History Narrative  ? Married   ? Never Smoked  ? Alcohol use- no  ? Drug use- no  ?   ?   ? ?Social Determinants of Health  ? ?Financial Resource Strain: Not on file  ?Food Insecurity: Not on file  ?Transportation Needs: Not on file  ?Physical Activity: Not on file  ?Stress: Not on file  ?Social Connections: Not on file  ? ? ?  Tobacco Counseling ?Counseling given: Not Answered ? ? ?Clinical Intake: ? ?  ? ?  ? ?  ? ?  ? ?  ? ?Diabetic? ? ?  ? ?  ? ? ?Activities of Daily Living ?   ? View : No data to display.  ?  ?  ?  ? ? ?Patient Care Team: ?Nelwyn Salisbury, MD as PCP - General ? ?Indicate any recent Medical Services you may have received from other than [facility] providers in the past year (date may be approximate). ? ?   ?Assessment:  ? This is a routine wellness examination for Debra Carlson. ? ?Hearing/Vision screen ?No results found. ? ?Dietary issues and exercise activities discussed: ?  ? ? Goals Addressed   ?None ?  ? ?Depression Screen ? ?  04/23/2020  ?  3:40 PM 04/23/2020  ?  3:36 PM 12/25/2015  ? 12:12 PM 07/29/2014  ? 11:29 AM  ?PHQ 2/9 Scores  ?PHQ - 2 Score 0 0 0 0  ?  ?Fall Risk ? ?  04/23/2020  ?  3:40 PM 12/25/2015  ? 12:12 PM 07/29/2014  ? 11:29 AM  ?Fall Risk    ?Falls in the past year? 0 No No  ?Number falls in past yr: 0    ?Injury with Fall? 0    ?Follow up Falls evaluation completed    ? ? ?FALL RISK PREVENTION PERTAINING TO THE HOME: ? ?Any stairs in  ?If so, are there any without handrails?  ?Home free of loose throw rugs in walkways, pet beds, electrical cords, etc?  ?Adequate lighting in your home to reduce risk of falls?  ? ?ASSISTIVE DEVICES UTILIZED TO PREVENT FALLS: ? ?Life alert?  ?Use of a cane, walker or w/c?  ?Grab bars in the bathroom?  ?Shower chair or bench in shower?  ?Elevated toilet seat or a handicapped toilet?  ? ?TIMED UP AND GO: ? ?Was the test performed? Marland Kitchen  ?Length of time to ambulate 10 feet:  sec.  ? ? ? ?Cognitive Function: ?  ?  ?  ? ?Immunizations ?Immunization History  ?Administered Date(s) Administered  ? Tdap 05/20/2017  ? ? ? ? ? ? ? ? ? ? ?Qualifies for Shingles Vaccine?   ?Zostavax completed   ? ? ?Screening Tests ?Health Maintenance  ?Topic Date Due  ? COVID-19 Vaccine (1) Never done  ? Zoster Vaccines- Shingrix (1 of 2) Never done  ? Pneumonia Vaccine 2+ Years old (1 - PCV) Never done  ? DEXA SCAN  Never done  ? INFLUENZA VACCINE  02/12/2034 (Originally 08/11/2021)  ? TETANUS/TDAP  05/21/2027  ? HPV VACCINES  Aged Out  ? ? ?Health Maintenance ? ?Health Maintenance Due  ?Topic Date Due  ? COVID-19 Vaccine (1) Never done  ? Zoster Vaccines- Shingrix (1 of 2) Never done  ? Pneumonia Vaccine 67+ Years old (1 - PCV) Never done  ? DEXA SCAN  Never done  ? ? ? ? ? ? ? ?Lung Cancer Screening: (Low Dose CT Chest recommended if Age 85-80 years, 30 pack-year currently smoking OR have quit w/in 15years.)  qualify.  ? ?Lung Cancer Screening Referral:  ? ?Additional Screening: ? ?Hepatitis C Screening:  qualify; Completed  ? ?Vision Screening: Recommended annual ophthalmology exams for early detection of glaucoma and other disorders of the eye. ?Is the patient up to date with their annual eye exam?   ?Who is the provider or what is the name of the  office in which the patient  attends annual eye exams?  ?If pt is not established with a provider, would they like to be referred to a provider to establish care? .  ? ?Dental Screening: Recommended annual dental exams for proper oral hygiene ? ?Community Resource Referral / Chronic Care Management: ?CRR required this visit?   ? ?CCM required this visit?   ? ? ?  ?Plan:  ?  ? ?I have personally reviewed and noted the following in the patient?s chart:  ? ?Medical and social history ?Use of alcohol, tobacco or illicit drugs  ?Current medications and supplements including opioid prescriptions.  ?Functional ability and status ?Nutritional status ?Physical activity ?Advanced directives ?List of other physicians ?Hospitalizations, surgeries, and ER visits in previous 12 months ?Vitals ?Screenings to include cognitive, depression, and falls ?Referrals and appointments ? ?In addition, I have reviewed and discussed with patient certain preventive protocols, quality metrics, and best practice recommendations. A written personalized care plan for preventive services as well as general preventive health recommendations were provided to patient. ?  ? ? ?Tillie RungBeverly W Tiffanny Lamarche, LPN   [id number]/[id number]/2023  ? ?Nurse Notes: This patient marked as No Show ? ? ? ? ? ?

## 2021-07-08 ENCOUNTER — Telehealth: Payer: Self-pay | Admitting: Family Medicine

## 2021-07-08 NOTE — Telephone Encounter (Signed)
Spoke to patient to schedule awv  She wanted call back end of july

## 2021-07-20 ENCOUNTER — Ambulatory Visit (INDEPENDENT_AMBULATORY_CARE_PROVIDER_SITE_OTHER): Payer: Medicare HMO

## 2021-07-20 VITALS — Ht 61.0 in | Wt 180.0 lb

## 2021-07-20 DIAGNOSIS — Z0000 Encounter for general adult medical examination without abnormal findings: Secondary | ICD-10-CM

## 2021-07-20 NOTE — Patient Instructions (Signed)
Debra Carlson , Thank you for taking time to come for your Medicare Wellness Visit. I appreciate your ongoing commitment to your health goals. Please review the following plan we discussed and let me know if I can assist you in the future.   These are the goals we discussed:  Goals      DIET - INCREASE WATER INTAKE        This is a list of the screening recommended for you and due dates:  Health Maintenance  Topic Date Due   COVID-19 Vaccine (1) Never done   Zoster (Shingles) Vaccine (1 of 2) Never done   Pneumonia Vaccine (1 - PCV) Never done   DEXA scan (bone density measurement)  Never done   Flu Shot  02/12/2034*   Tetanus Vaccine  05/21/2027   HPV Vaccine  Aged Out  *Topic was postponed. The date shown is not the original due date.    Advanced directives: No  Conditions/risks identified: None  Next appointment: Follow up in one year for your annual wellness visit     Preventive Care 65 Years and Older, Female Preventive care refers to lifestyle choices and visits with your health care provider that can promote health and wellness. What does preventive care include? A yearly physical exam. This is also called an annual well check. Dental exams once or twice a year. Routine eye exams. Ask your health care provider how often you should have your eyes checked. Personal lifestyle choices, including: Daily care of your teeth and gums. Regular physical activity. Eating a healthy diet. Avoiding tobacco and drug use. Limiting alcohol use. Practicing safe sex. Taking low-dose aspirin every day. Taking vitamin and mineral supplements as recommended by your health care provider. What happens during an annual well check? The services and screenings done by your health care provider during your annual well check will depend on your age, overall health, lifestyle risk factors, and family history of disease. Counseling  Your health care provider may ask you questions about  your: Alcohol use. Tobacco use. Drug use. Emotional well-being. Home and relationship well-being. Sexual activity. Eating habits. History of falls. Memory and ability to understand (cognition). Work and work Astronomer. Reproductive health. Screening  You may have the following tests or measurements: Height, weight, and BMI. Blood pressure. Lipid and cholesterol levels. These may be checked every 5 years, or more frequently if you are over 52 years old. Skin check. Lung cancer screening. You may have this screening every year starting at age 32 if you have a 30-pack-year history of smoking and currently smoke or have quit within the past 15 years. Fecal occult blood test (FOBT) of the stool. You may have this test every year starting at age 22. Flexible sigmoidoscopy or colonoscopy. You may have a sigmoidoscopy every 5 years or a colonoscopy every 10 years starting at age 66. Hepatitis C blood test. Hepatitis B blood test. Sexually transmitted disease (STD) testing. Diabetes screening. This is done by checking your blood sugar (glucose) after you have not eaten for a while (fasting). You may have this done every 1-3 years. Bone density scan. This is done to screen for osteoporosis. You may have this done starting at age 17. Mammogram. This may be done every 1-2 years. Talk to your health care provider about how often you should have regular mammograms. Talk with your health care provider about your test results, treatment options, and if necessary, the need for more tests. Vaccines  Your health care provider may  recommend certain vaccines, such as: Influenza vaccine. This is recommended every year. Tetanus, diphtheria, and acellular pertussis (Tdap, Td) vaccine. You may need a Td booster every 10 years. Zoster vaccine. You may need this after age 58. Pneumococcal 13-valent conjugate (PCV13) vaccine. One dose is recommended after age 43. Pneumococcal polysaccharide (PPSV23) vaccine.  One dose is recommended after age 1. Talk to your health care provider about which screenings and vaccines you need and how often you need them. This information is not intended to replace advice given to you by your health care provider. Make sure you discuss any questions you have with your health care provider. Document Released: 01/24/2015 Document Revised: 09/17/2015 Document Reviewed: 10/29/2014 Elsevier Interactive Patient Education  2017 [employer] Prevention in the Home Falls can cause injuries. They can happen to people of all ages. There are many things you can do to make your home safe and to help prevent falls. What can I do on the outside of my home? Regularly fix the edges of walkways and driveways and fix any cracks. Remove anything that might make you trip as you walk through a door, such as a raised step or threshold. Trim any bushes or trees on the path to your home. Use bright outdoor lighting. Clear any walking paths of anything that might make someone trip, such as rocks or tools. Regularly check to see if handrails are loose or broken. Make sure that both sides of any steps have handrails. Any raised decks and porches should have guardrails on the edges. Have any leaves, snow, or ice cleared regularly. Use sand or salt on walking paths during winter. Clean up any spills in your garage right away. This includes oil or grease spills. What can I do in the bathroom? Use night lights. Install grab bars by the toilet and in the tub and shower. Do not use towel bars as grab bars. Use non-skid mats or decals in the tub or shower. If you need to sit down in the shower, use a plastic, non-slip stool. Keep the floor dry. Clean up any water that spills on the floor as soon as it happens. Remove soap buildup in the tub or shower regularly. Attach bath mats securely with double-sided non-slip rug tape. Do not have throw rugs and other things on the floor that can make  you trip. What can I do in the bedroom? Use night lights. Make sure that you have a light by your bed that is easy to reach. Do not use any sheets or blankets that are too big for your bed. They should not hang down onto the floor. Have a firm chair that has side arms. You can use this for support while you get dressed. Do not have throw rugs and other things on the floor that can make you trip. What can I do in the kitchen? Clean up any spills right away. Avoid walking on wet floors. Keep items that you use a lot in easy-to-reach places. If you need to reach something above you, use a strong step stool that has a grab bar. Keep electrical cords out of the way. Do not use floor polish or wax that makes floors slippery. If you must use wax, use non-skid floor wax. Do not have throw rugs and other things on the floor that can make you trip. What can I do with my stairs? Do not leave any items on the stairs. Make sure that there are handrails on both sides of  the stairs and use them. Fix handrails that are broken or loose. Make sure that handrails are as long as the stairways. Check any carpeting to make sure that it is firmly attached to the stairs. Fix any carpet that is loose or worn. Avoid having throw rugs at the top or bottom of the stairs. If you do have throw rugs, attach them to the floor with carpet tape. Make sure that you have a light switch at the top of the stairs and the bottom of the stairs. If you do not have them, ask someone to add them for you. What else can I do to help prevent falls? Wear shoes that: Do not have high heels. Have rubber bottoms. Are comfortable and fit you well. Are closed at the toe. Do not wear sandals. If you use a stepladder: Make sure that it is fully opened. Do not climb a closed stepladder. Make sure that both sides of the stepladder are locked into place. Ask someone to hold it for you, if possible. Clearly mark and make sure that you can  see: Any grab bars or handrails. First and last steps. Where the edge of each step is. Use tools that help you move around (mobility aids) if they are needed. These include: Canes. Walkers. Scooters. Crutches. Turn on the lights when you go into a dark area. Replace any light bulbs as soon as they burn out. Set up your furniture so you have a clear path. Avoid moving your furniture around. If any of your floors are uneven, fix them. If there are any pets around you, be aware of where they are. Review your medicines with your doctor. Some medicines can make you feel dizzy. This can increase your chance of falling. Ask your doctor what other things that you can do to help prevent falls. This information is not intended to replace advice given to you by your health care provider. Make sure you discuss any questions you have with your health care provider. Document Released: 10/24/2008 Document Revised: 06/05/2015 Document Reviewed: 02/01/2014 Elsevier Interactive Patient Education  2017 [employer].

## 2021-07-20 NOTE — Progress Notes (Signed)
Subjective:   Debra Carlson is a 86 y.o. female who presents for Medicare Annual (Subsequent) preventive examination.  Review of Systems    Virtual Visit via Telephone Note  I connected with  Corie Allis on 07/20/21 at  1:00 PM EDT by telephone and verified that I am speaking with the correct person using two identifiers.  Location: Patient: Home Provider: Office Persons participating in the virtual visit: patient/Nurse Health Advisor   I discussed the limitations, risks, security and privacy concerns of performing an evaluation and management service by telephone and the availability of in person appointments. The patient expressed understanding and agreed to proceed.  Interactive audio and video telecommunications were attempted between this nurse and patient, however failed, due to patient having technical difficulties OR patient did not have access to video capability.  We continued and completed visit with audio only.  Some vital signs may be absent or patient reported.   Tillie Rung, LPN  Cardiac Risk Factors include: advanced age (>70men, >13 women);hypertension     Objective:    Today's Vitals   07/20/21 1308  Weight: 180 lb (81.6 kg)  Height: 5\' 1"  (1.549 m)   Body mass index is 34.01 kg/m.     07/20/2021    1:16 PM 04/23/2020    3:38 PM 08/14/2014    1:18 PM 07/12/2014    8:28 PM 06/06/2014    9:11 AM  Advanced Directives  Does Patient Have a Medical Advance Directive? No Yes No No No  Type of 06/08/2014 of [place];Living will     Does patient want to make changes to medical advance directive?  No - Patient declined     Copy of Healthcare Power of Attorney in Chart?  No - copy requested     Would patient like information on creating a medical advance directive? No - Patient declined   No - patient declined information Yes - Educational materials given    Current Medications (verified) Outpatient Encounter Medications  as of 07/20/2021  Medication Sig   furosemide (LASIX) 20 MG tablet TAKE 1 TABLET BY MOUTH EVERY DAY   lamoTRIgine (LAMICTAL) 25 MG tablet TAKE 2 TABLETS BY MOUTH EVERY DAY   meclizine (ANTIVERT) 25 MG tablet Take 1 tablet (25 mg total) by mouth every 4 (four) hours as needed for dizziness.   misoprostol (CYTOTEC) 200 MCG tablet Place 2 tablets vaginally 6-12 hours prior to your office visit   Multiple Vitamin (MULTIVITAMIN) tablet Take 1 tablet by mouth daily.   naproxen sodium (ALEVE) 220 MG tablet Take 220 mg by mouth.   trolamine salicylate (ASPERCREME) 10 % cream Apply 1 application topically as needed (for knees).   No facility-administered encounter medications on file as of 07/20/2021.    Allergies (verified) Patient has no known allergies.   History: Past Medical History:  Diagnosis Date   Cataract    Hypertension    Osteoarthritis    Seizure disorder (HCC)    single seizure in 2005, sees Dr. 2006   Seizures [facility])    last seizure in 2011   Past Surgical History:  Procedure Laterality Date   CESAREAN SECTION     TONSILLECTOMY     Family History  Problem Relation Age of Onset   Colon cancer Mother    Heart attack Father    Hypertension Maternal Aunt    Breast cancer Maternal Aunt 24   Social History   Socioeconomic History   Marital status:  Married    Spouse name: Not on file   Number of children: 1   Years of education: 12+   Highest education level: Not on file  Occupational History   Occupation: Retired   Tobacco Use   Smoking status: Never   Smokeless tobacco: Never  Substance and Sexual Activity   Alcohol use: No    Alcohol/week: 0.0 standard drinks of alcohol   Drug use: No   Sexual activity: Not Currently    Partners: Male    Birth control/protection: Post-menopausal  Other Topics Concern   Not on file  Social History Narrative   Married    Never Smoked   Alcohol use- no   Drug use- no         Social Determinants of Health    Financial Resource Strain: Low Risk  (07/20/2021)   Overall Financial Resource Strain (CARDIA)    Difficulty of Paying Living Expenses: Not hard at all  Food Insecurity: No Food Insecurity (07/20/2021)   Hunger Vital Sign    Worried About Running Out of Food in the Last Year: Never true    Ran Out of Food in the Last Year: Never true  Transportation Needs: No Transportation Needs (07/20/2021)   PRAPARE - Administrator, Civil Service (Medical): No    Lack of Transportation (Non-Medical): No  Physical Activity: Insufficiently Active (07/20/2021)   Exercise Vital Sign    Days of Exercise per Week: 3 days    Minutes of Exercise per Session: 20 min  Stress: No Stress Concern Present (07/20/2021)   [employer] of Occupational Health - Occupational Stress Questionnaire    Feeling of Stress : Not at all  Social Connections: Unknown (07/20/2021)   Social Connection and Isolation Panel [NHANES]    Frequency of Communication with Friends and Family: More than three times a week    Frequency of Social Gatherings with Friends and Family: More than three times a week    Attends Religious Services: More than 4 times per year    Active Member of [employer] or Organizations: Yes    Attends Engineer, structural: More than 4 times per year    Marital Status: Not on file     Clinical Intake:  Pre-visit preparation completed: No  Pain : No/denies pain     BMI - recorded: 36.3 Nutritional Risks: None Diabetes: No  How often do you need to have someone help you when you read instructions, pamphlets, or other written materials from your doctor or pharmacy?: 1 - Never  Diabetic?  No  Interpreter Needed?: No  Information entered by :: Theresa Mulligan LPN   Activities of Daily Living    07/20/2021    1:14 PM  In your present state of health, do you have any difficulty performing the following activities:  Hearing? 0  Vision? 0  Difficulty concentrating or making  decisions? 0  Walking or climbing stairs? 0  Dressing or bathing? 0  Doing errands, shopping? 0  Preparing Food and eating ? N  Using the Toilet? N  In the past six months, have you accidently leaked urine? Y  Comment Wears pads. Followed by PCP  Do you have problems with loss of bowel control? N  Managing your Medications? N  Managing your Finances? N  Housekeeping or managing your Housekeeping? N    Patient Care Team: Nelwyn Salisbury, MD as PCP - General  Indicate any recent Medical Services you may have received from other than  [facility] providers in the past year (date may be approximate).     Assessment:   This is a routine wellness examination for Laila.  Hearing/Vision screen Hearing Screening - Comments:: No hearing difficulty. Vision Screening - Comments:: Wears glasses.  Dietary issues and exercise activities discussed: Exercise limited by: None identified   Goals Addressed               This Visit's Progress     No current goal (pt-stated)         Depression Screen    07/20/2021    1:12 PM 04/23/2020    3:40 PM 04/23/2020    3:36 PM 12/25/2015   12:12 PM 07/29/2014   11:29 AM  PHQ 2/9 Scores  PHQ - 2 Score 0 0 0 0 0    Fall Risk    07/20/2021    1:15 PM 04/23/2020    3:40 PM 12/25/2015   12:12 PM 07/29/2014   11:29 AM  Fall Risk   Falls in the past year? 0 0 No No  Number falls in past yr: 0 0    Injury with Fall? 0 0    Risk for fall due to : No Fall Risks     Follow up  Falls evaluation completed      FALL RISK PREVENTION PERTAINING TO THE HOME:  Any stairs in or around the home? Yes  If so, are there any without handrails? No  Home free of loose throw rugs in walkways, pet beds, electrical cords, etc? Yes  Adequate lighting in your home to reduce risk of falls? Yes   ASSISTIVE DEVICES UTILIZED TO PREVENT FALLS:  Life alert? No  Use of a cane, walker or w/c? Yes  Grab bars in the bathroom? Yes  Shower chair or bench in shower?  Yes Elevated toilet seat or a handicapped toilet? No   TIMED UP AND GO:  Was the test performed? No . Audio Visit  Cognitive Function:        07/20/2021    1:16 PM  6CIT Screen  What Year? 0 points  What month? 0 points  What time? 0 points  Count back from 20 0 points  Months in reverse 0 points  Repeat phrase 0 points  Total Score 0 points    Immunizations Immunization History  Administered Date(s) Administered   PFIZER(Purple Top)SARS-COV-2 Vaccination 08/18/2019   Tdap 05/20/2017    TDAP status: Up to date    Pneumococcal vaccine status: Declined,  Education has been provided regarding the importance of this vaccine but patient still declined. Advised may receive this vaccine at local pharmacy or Health Dept. Aware to provide a copy of the vaccination record if obtained from local pharmacy or Health Dept. Verbalized acceptance and understanding.   Covid-19 vaccine status: Completed vaccines  Qualifies for Shingles Vaccine? Yes   Zostavax completed No   Shingrix Completed?: No.    Education has been provided regarding the importance of this vaccine. Patient has been advised to call insurance company to determine out of pocket expense if they have not yet received this vaccine. Advised may also receive vaccine at local pharmacy or Health Dept. Verbalized acceptance and understanding.  Screening Tests Health Maintenance  Topic Date Due   COVID-19 Vaccine (2 - Pfizer series) 08/05/2021 (Originally 10/13/2019)   Zoster Vaccines- Shingrix (1 of 2) 10/20/2021 (Originally 05/06/1987)   Pneumonia Vaccine 63+ Years old (1 - PCV) 07/21/2022 (Originally 05/06/2002)   DEXA SCAN  07/21/2022 (Originally 05/06/2002)  INFLUENZA VACCINE  02/12/2034 (Originally 08/11/2021)   TETANUS/TDAP  05/21/2027   HPV VACCINES  Aged Out    Health Maintenance  There are no preventive care reminders to display for this patient.   Colorectal cancer screening: No longer required.   Mammogram  status: No longer required due to Age.  Bone Density status: Ordered Patient deferred. Pt provided with contact info and advised to call to schedule appt.  Lung Cancer Screening: (Low Dose CT Chest recommended if Age 53-80 years, 30 pack-year currently smoking OR have quit w/in 15years.) does not qualify.     Additional Screening:  Hepatitis C Screening: does not qualify; Completed   Vision Screening: Recommended annual ophthalmology exams for early detection of glaucoma and other disorders of the eye. Is the patient up to date with their annual eye exam?  Yes  Who is the provider or what is the name of the office in which the patient attends annual eye exams? Patient deferred If pt is not established with a provider, would they like to be referred to a provider to establish care? No .   Dental Screening: Recommended annual dental exams for proper oral hygiene  Community Resource Referral / Chronic Care Management:  CRR required this visit?  No   CCM required this visit?  No      Plan:     I have personally reviewed and noted the following in the patient's chart:   Medical and social history Use of alcohol, tobacco or illicit drugs  Current medications and supplements including opioid prescriptions.  Functional ability and status Nutritional status Physical activity Advanced directives List of other physicians Hospitalizations, surgeries, and ER visits in previous 12 months Vitals Screenings to include cognitive, depression, and falls Referrals and appointments  In addition, I have reviewed and discussed with patient certain preventive protocols, quality metrics, and best practice recommendations. A written personalized care plan for preventive services as well as general preventive health recommendations were provided to patient.     Tillie Rung, LPN   [id number]/61/9509   Nurse Notes: None

## 2021-08-12 ENCOUNTER — Other Ambulatory Visit: Payer: Self-pay | Admitting: Family Medicine

## 2021-08-12 NOTE — Telephone Encounter (Signed)
Pt needs appointment for further refills 

## 2022-02-10 ENCOUNTER — Other Ambulatory Visit: Payer: Self-pay | Admitting: Family Medicine

## 2022-07-26 ENCOUNTER — Ambulatory Visit: Payer: Medicare HMO

## 2022-07-26 VITALS — Ht 62.0 in | Wt 180.0 lb

## 2022-07-26 DIAGNOSIS — Z0000 Encounter for general adult medical examination without abnormal findings: Secondary | ICD-10-CM | POA: Diagnosis not present

## 2022-07-26 NOTE — Patient Instructions (Addendum)
[name] , Thank you for taking time to come for your Medicare Wellness Visit. I appreciate your ongoing commitment to your health goals. Please review the following plan we discussed and let me know if I can assist you in the future.   These are the goals we discussed:  Goals       DIET - INCREASE WATER INTAKE      No current goal (pt-stated)      Stay Healthy (pt-stated)        This is a list of the screening recommended for you and due dates:  Health Maintenance  Topic Date Due   COVID-19 Vaccine (2 - 2023-24 season) 08/11/2022*   Zoster (Shingles) Vaccine (1 of 2) 10/26/2022*   Pneumonia Vaccine (1 of 1 - PCV) 07/26/2023*   DEXA scan (bone density measurement)  07/26/2023*   Flu Shot  02/12/2034*   Medicare Annual Wellness Visit  07/26/2023   DTaP/Tdap/Td vaccine (2 - Td or Tdap) 05/21/2027   HPV Vaccine  Aged Out  *Topic was postponed. The date shown is not the original due date.    Advanced directives: Advance directive discussed with you today. Even though you declined this today, please call our office should you change your mind, and we can give you the proper paperwork for you to fill out.   Conditions/risks identified: None.  Next appointment: Follow up in one year for your annual wellness visit    Preventive Care 65 Years and Older, Female Preventive care refers to lifestyle choices and visits with your health care provider that can promote health and wellness. What does preventive care include? A yearly physical exam. This is also called an annual well check. Dental exams once or twice a year. Routine eye exams. Ask your health care provider how often you should have your eyes checked. Personal lifestyle choices, including: Daily care of your teeth and gums. Regular physical activity. Eating a healthy diet. Avoiding tobacco and drug use. Limiting alcohol use. Practicing safe sex. Taking low-dose aspirin every day. Taking vitamin and mineral supplements as  recommended by your health care provider. What happens during an annual well check? The services and screenings done by your health care provider during your annual well check will depend on your age, overall health, lifestyle risk factors, and family history of disease. Counseling  Your health care provider may ask you questions about your: Alcohol use. Tobacco use. Drug use. Emotional well-being. Home and relationship well-being. Sexual activity. Eating habits. History of falls. Memory and ability to understand (cognition). Work and work Astronomer. Reproductive health. Screening  You may have the following tests or measurements: Height, weight, and BMI. Blood pressure. Lipid and cholesterol levels. These may be checked every 5 years, or more frequently if you are over 40 years old. Skin check. Lung cancer screening. You may have this screening every year starting at age 69 if you have a 30-pack-year history of smoking and currently smoke or have quit within the past 15 years. Fecal occult blood test (FOBT) of the stool. You may have this test every year starting at age 83. Flexible sigmoidoscopy or colonoscopy. You may have a sigmoidoscopy every 5 years or a colonoscopy every 10 years starting at age 31. Hepatitis C blood test. Hepatitis B blood test. Sexually transmitted disease (STD) testing. Diabetes screening. This is done by checking your blood sugar (glucose) after you have not eaten for a while (fasting). You may have this done every 1-3 years. Bone density scan. This  is done to screen for osteoporosis. You may have this done starting at age 72. Mammogram. This may be done every 1-2 years. Talk to your health care provider about how often you should have regular mammograms. Talk with your health care provider about your test results, treatment options, and if necessary, the need for more tests. Vaccines  Your health care provider may recommend certain vaccines, such  as: Influenza vaccine. This is recommended every year. Tetanus, diphtheria, and acellular pertussis (Tdap, Td) vaccine. You may need a Td booster every 10 years. Zoster vaccine. You may need this after age 2. Pneumococcal 13-valent conjugate (PCV13) vaccine. One dose is recommended after age 29. Pneumococcal polysaccharide (PPSV23) vaccine. One dose is recommended after age 34. Talk to your health care provider about which screenings and vaccines you need and how often you need them. This information is not intended to replace advice given to you by your health care provider. Make sure you discuss any questions you have with your health care provider. Document Released: 01/24/2015 Document Revised: 09/17/2015 Document Reviewed: 10/29/2014 Elsevier Interactive Patient Education  2017 [employer].  Fall Prevention in the Home Falls can cause injuries. They can happen to people of all ages. There are many things you can do to make your home safe and to help prevent falls. What can I do on the outside of my home? Regularly fix the edges of walkways and driveways and fix any cracks. Remove anything that might make you trip as you walk through a door, such as a raised step or threshold. Trim any bushes or trees on the path to your home. Use bright outdoor lighting. Clear any walking paths of anything that might make someone trip, such as rocks or tools. Regularly check to see if handrails are loose or broken. Make sure that both sides of any steps have handrails. Any raised decks and porches should have guardrails on the edges. Have any leaves, snow, or ice cleared regularly. Use sand or salt on walking paths during winter. Clean up any spills in your garage right away. This includes oil or grease spills. What can I do in the bathroom? Use night lights. Install grab bars by the toilet and in the tub and shower. Do not use towel bars as grab bars. Use non-skid mats or decals in the tub or  shower. If you need to sit down in the shower, use a plastic, non-slip stool. Keep the floor dry. Clean up any water that spills on the floor as soon as it happens. Remove soap buildup in the tub or shower regularly. Attach bath mats securely with double-sided non-slip rug tape. Do not have throw rugs and other things on the floor that can make you trip. What can I do in the bedroom? Use night lights. Make sure that you have a light by your bed that is easy to reach. Do not use any sheets or blankets that are too big for your bed. They should not hang down onto the floor. Have a firm chair that has side arms. You can use this for support while you get dressed. Do not have throw rugs and other things on the floor that can make you trip. What can I do in the kitchen? Clean up any spills right away. Avoid walking on wet floors. Keep items that you use a lot in easy-to-reach places. If you need to reach something above you, use a strong step stool that has a grab bar. Keep electrical cords out  of the way. Do not use floor polish or wax that makes floors slippery. If you must use wax, use non-skid floor wax. Do not have throw rugs and other things on the floor that can make you trip. What can I do with my stairs? Do not leave any items on the stairs. Make sure that there are handrails on both sides of the stairs and use them. Fix handrails that are broken or loose. Make sure that handrails are as long as the stairways. Check any carpeting to make sure that it is firmly attached to the stairs. Fix any carpet that is loose or worn. Avoid having throw rugs at the top or bottom of the stairs. If you do have throw rugs, attach them to the floor with carpet tape. Make sure that you have a light switch at the top of the stairs and the bottom of the stairs. If you do not have them, ask someone to add them for you. What else can I do to help prevent falls? Wear shoes that: Do not have high heels. Have  rubber bottoms. Are comfortable and fit you well. Are closed at the toe. Do not wear sandals. If you use a stepladder: Make sure that it is fully opened. Do not climb a closed stepladder. Make sure that both sides of the stepladder are locked into place. Ask someone to hold it for you, if possible. Clearly mark and make sure that you can see: Any grab bars or handrails. First and last steps. Where the edge of each step is. Use tools that help you move around (mobility aids) if they are needed. These include: Canes. Walkers. Scooters. Crutches. Turn on the lights when you go into a dark area. Replace any light bulbs as soon as they burn out. Set up your furniture so you have a clear path. Avoid moving your furniture around. If any of your floors are uneven, fix them. If there are any pets around you, be aware of where they are. Review your medicines with your doctor. Some medicines can make you feel dizzy. This can increase your chance of falling. Ask your doctor what other things that you can do to help prevent falls. This information is not intended to replace advice given to you by your health care provider. Make sure you discuss any questions you have with your health care provider. Document Released: 10/24/2008 Document Revised: 06/05/2015 Document Reviewed: 02/01/2014 Elsevier Interactive Patient Education  2017 [employer].

## 2022-07-26 NOTE — Progress Notes (Signed)
Subjective:   Debra Carlson is a 87 y.o. female who presents for Medicare Annual (Subsequent) preventive examination.  Visit Complete: Virtual  I connected with  Debra Carlson on 07/26/22 by a audio enabled telemedicine application and verified that I am speaking with the correct person using two identifiers.  Patient Location: Home  Provider Location: Home Office  I discussed the limitations of evaluation and management by telemedicine. The patient expressed understanding and agreed to proceed.  Patient Medicare AWV questionnaire was completed by the patient on  ; I have confirmed that all information answered by patient is correct and no changes since this date.  Review of Systems      Cardiac Risk Factors include: advanced age (>63men, >60 women);hypertension     Objective:    Today's Vitals   07/26/22 1429  Weight: 180 lb (81.6 kg)  Height: 5\' 2"  (1.575 m)   Body mass index is 32.92 kg/m.     07/26/2022    2:39 PM 07/20/2021    1:16 PM 04/23/2020    3:38 PM 08/14/2014    1:18 PM 07/12/2014    8:28 PM 06/06/2014    9:11 AM  Advanced Directives  Does Patient Have a Medical Advance Directive? No No Yes No No No  Type of Surveyor, minerals;Living will     Does patient want to make changes to medical advance directive?   No - Patient declined     Copy of Healthcare Power of Attorney in Chart?   No - copy requested     Would patient like information on creating a medical advance directive? No - Patient declined No - Patient declined   No - patient declined information Yes - Educational materials given    Current Medications (verified) Outpatient Encounter Medications as of 07/26/2022  Medication Sig   furosemide (LASIX) 20 MG tablet TAKE 1 TABLET BY MOUTH EVERY DAY   lamoTRIgine (LAMICTAL) 25 MG tablet TAKE 2 TABLETS BY MOUTH EVERY DAY   meclizine (ANTIVERT) 25 MG tablet Take 1 tablet (25 mg total) by mouth every 4 (four) hours as  needed for dizziness.   misoprostol (CYTOTEC) 200 MCG tablet Place 2 tablets vaginally 6-12 hours prior to your office visit   Multiple Vitamin (MULTIVITAMIN) tablet Take 1 tablet by mouth daily.   naproxen sodium (ALEVE) 220 MG tablet Take 220 mg by mouth.   trolamine salicylate (ASPERCREME) 10 % cream Apply 1 application topically as needed (for knees).   No facility-administered encounter medications on file as of 07/26/2022.    Allergies (verified) Patient has no known allergies.   History: Past Medical History:  Diagnosis Date   Cataract    Hypertension    Osteoarthritis    Seizure disorder (HCC)    single seizure in 2005, sees Dr. Kelli Hope   Seizures [facility])    last seizure in 2011   Past Surgical History:  Procedure Laterality Date   CESAREAN SECTION     TONSILLECTOMY     Family History  Problem Relation Age of Onset   Colon cancer Mother    Heart attack Father    Hypertension Maternal Aunt    Breast cancer Maternal Aunt 93   Social History   Socioeconomic History   Marital status: Married    Spouse name: Not on file   Number of children: 1   Years of education: 12+   Highest education level: Not on file  Occupational History  Occupation: Retired   Tobacco Use   Smoking status: Never   Smokeless tobacco: Never  Substance and Sexual Activity   Alcohol use: No    Alcohol/week: 0.0 standard drinks of alcohol   Drug use: No   Sexual activity: Not Currently    Partners: Male    Birth control/protection: Post-menopausal  Other Topics Concern   Not on file  Social History Narrative   Married    Never Smoked   Alcohol use- no   Drug use- no         Social Determinants of Health   Financial Resource Strain: Low Risk  (07/26/2022)   Overall Financial Resource Strain (CARDIA)    Difficulty of Paying Living Expenses: Not hard at all  Food Insecurity: No Food Insecurity (07/26/2022)   Hunger Vital Sign    Worried About Running Out of Food in the  Last Year: Never true    Ran Out of Food in the Last Year: Never true  Transportation Needs: No Transportation Needs (07/26/2022)   PRAPARE - Administrator, Civil Service (Medical): No    Lack of Transportation (Non-Medical): No  Physical Activity: Inactive (07/26/2022)   Exercise Vital Sign    Days of Exercise per Week: 0 days    Minutes of Exercise per Session: 0 min  Stress: No Stress Concern Present (07/26/2022)   [employer] of Occupational Health - Occupational Stress Questionnaire    Feeling of Stress : Not at all  Social Connections: Moderately Integrated (07/26/2022)   Social Connection and Isolation Panel [NHANES]    Frequency of Communication with Friends and Family: More than three times a week    Frequency of Social Gatherings with Friends and Family: More than three times a week    Attends Religious Services: More than 4 times per year    Active Member of [employer] or Organizations: Yes    Attends Banker Meetings: More than 4 times per year    Marital Status: Widowed    Tobacco Counseling Counseling given: Not Answered   Clinical Intake:  Pre-visit preparation completed: No  Pain : No/denies pain     BMI - recorded: 32.92 Nutritional Status: BMI > 30  Obese Nutritional Risks: None Diabetes: No  How often do you need to have someone help you when you read instructions, pamphlets, or other written materials from your doctor or pharmacy?: 1 - Never  Interpreter Needed?: No  Information entered by :: Theresa Mulligan LPN   Activities of Daily Living    07/26/2022    2:38 PM  In your present state of health, do you have any difficulty performing the following activities:  Hearing? 0  Vision? 0  Difficulty concentrating or making decisions? 0  Walking or climbing stairs? 0  Dressing or bathing? 0  Doing errands, shopping? 0  Preparing Food and eating ? N  Using the Toilet? N  In the past six months, have you accidently leaked  urine? Y  Comment Wears pads. Followed by PCP  Do you have problems with loss of bowel control? N  Managing your Medications? N  Managing your Finances? N  Housekeeping or managing your Housekeeping? N    Patient Care Team: Nelwyn Salisbury, MD as PCP - General  Indicate any recent Medical Services you may have received from other than [facility] providers in the past year (date may be approximate).     Assessment:   This is a routine wellness examination for Debra Carlson.  Hearing/Vision screen Hearing Screening - Comments:: Denies hearing difficulties   Vision Screening - Comments:: Wears rx glasses - up to date with routine eye exams with  Deferred  Dietary issues and exercise activities discussed:     Goals Addressed               This Visit's Progress     Stay Healthy (pt-stated)         Depression Screen    07/26/2022    2:37 PM 07/20/2021    1:12 PM 04/23/2020    3:40 PM 04/23/2020    3:36 PM 12/25/2015   12:12 PM 07/29/2014   11:29 AM  PHQ 2/9 Scores  PHQ - 2 Score 0 0 0 0 0 0    Fall Risk    07/26/2022    2:38 PM 07/20/2021    1:15 PM 04/23/2020    3:40 PM 12/25/2015   12:12 PM 07/29/2014   11:29 AM  Fall Risk   Falls in the past year? 0 0 0 No No  Number falls in past yr: 0 0 0    Injury with Fall? 0 0 0    Risk for fall due to : No Fall Risks No Fall Risks     Follow up Falls prevention discussed  Falls evaluation completed      MEDICARE RISK AT HOME:  Medicare Risk at Home - 07/26/22 1443     Any stairs in or around the home? No    If so, are there any without handrails? Yes    Home free of loose throw rugs in walkways, pet beds, electrical cords, etc? Yes    Adequate lighting in your home to reduce risk of falls? Yes    Life alert? Yes    Use of a cane, walker or w/c? Yes    Grab bars in the bathroom? Yes    Shower chair or bench in shower? Yes    Elevated toilet seat or a handicapped toilet? Yes             TIMED UP AND GO:  Was the test  performed?  No    Cognitive Function:        07/26/2022    2:39 PM 07/20/2021    1:16 PM  6CIT Screen  What Year? 0 points 0 points  What month? 0 points 0 points  What time? 0 points 0 points  Count back from 20 0 points 0 points  Months in reverse 0 points 0 points  Repeat phrase 0 points 0 points  Total Score 0 points 0 points    Immunizations Immunization History  Administered Date(s) Administered   PFIZER(Purple Top)SARS-COV-2 Vaccination 08/18/2019   Tdap 05/20/2017    TDAP status: Up to date  Flu Vaccine status: Up to date  Pneumococcal vaccine status: Up to date  Covid-19 vaccine status: Completed vaccines  Qualifies for Shingles Vaccine? Yes   Zostavax completed No   Shingrix Completed?: No.    Education has been provided regarding the importance of this vaccine. Patient has been advised to call insurance company to determine out of pocket expense if they have not yet received this vaccine. Advised may also receive vaccine at local pharmacy or Health Dept. Verbalized acceptance and understanding.  Screening Tests Health Maintenance  Topic Date Due   COVID-19 Vaccine (2 - 2023-24 season) 08/11/2022 (Originally 09/11/2021)   Zoster Vaccines- Shingrix (1 of 2) 10/26/2022 (Originally 05/06/1987)   Pneumonia Vaccine 54+ Years old (1 of  1 - PCV) 07/26/2023 (Originally 05/06/2002)   DEXA SCAN  07/26/2023 (Originally 05/06/2002)   INFLUENZA VACCINE  02/12/2034 (Originally 08/12/2022)   Medicare Annual Wellness (AWV)  07/26/2023   DTaP/Tdap/Td (2 - Td or Tdap) 05/21/2027   HPV VACCINES  Aged Out    Health Maintenance  There are no preventive care reminders to display for this patient.   Colorectal cancer screening: No longer required.   Mammogram status: No longer required due to Age.  Bone Density status: Ordered Deferred. Pt provided with contact info and advised to call to schedule appt.  Lung Cancer Screening: (Low Dose CT Chest recommended if Age 103-80 years,  20 pack-year currently smoking OR have quit w/in 15years.) does not qualify.     Additional Screening:  Hepatitis C Screening: does not qualify; Completed   Vision Screening: Recommended annual ophthalmology exams for early detection of glaucoma and other disorders of the eye. Is the patient up to date with their annual eye exam?  Yes  Who is the provider or what is the name of the office in which the patient attends annual eye exams? Deferred If pt is not established with a provider, would they like to be referred to a provider to establish care? No .   Dental Screening: Recommended annual dental exams for proper oral hygiene    Community Resource Referral / Chronic Care Management:  CRR required this visit?  No   CCM required this visit?  No     Plan:     I have personally reviewed and noted the following in the patient's chart:   Medical and social history Use of alcohol, tobacco or illicit drugs  Current medications and supplements including opioid prescriptions. Patient is not currently taking opioid prescriptions. Functional ability and status Nutritional status Physical activity Advanced directives List of other physicians Hospitalizations, surgeries, and ER visits in previous 12 months Vitals Screenings to include cognitive, depression, and falls Referrals and appointments  In addition, I have reviewed and discussed with patient certain preventive protocols, quality metrics, and best practice recommendations. A written personalized care plan for preventive services as well as general preventive health recommendations were provided to patient.     Tillie Rung, LPN   1/61/0960   After Visit Summary: (MyChart) Due to this being a telephonic visit, the after visit summary with patients personalized plan was offered to patient via MyChart   Nurse Notes: None

## 2023-02-24 DIAGNOSIS — J3489 Other specified disorders of nose and nasal sinuses: Secondary | ICD-10-CM | POA: Diagnosis not present

## 2023-02-24 DIAGNOSIS — R519 Headache, unspecified: Secondary | ICD-10-CM | POA: Diagnosis not present

## 2023-02-24 DIAGNOSIS — R0981 Nasal congestion: Secondary | ICD-10-CM | POA: Diagnosis not present

## 2023-02-24 DIAGNOSIS — R062 Wheezing: Secondary | ICD-10-CM | POA: Diagnosis not present

## 2023-02-24 DIAGNOSIS — R051 Acute cough: Secondary | ICD-10-CM | POA: Diagnosis not present

## 2023-06-20 ENCOUNTER — Emergency Department (HOSPITAL_COMMUNITY)
Admission: EM | Admit: 2023-06-20 | Discharge: 2023-06-21 | Disposition: A | Discharge status: Home / Self Care | Attending: Emergency Medicine | Admitting: Emergency Medicine

## 2023-06-20 ENCOUNTER — Encounter (HOSPITAL_COMMUNITY): Payer: Self-pay

## 2023-06-20 ENCOUNTER — Emergency Department (HOSPITAL_COMMUNITY)

## 2023-06-20 ENCOUNTER — Other Ambulatory Visit: Payer: Self-pay

## 2023-06-20 DIAGNOSIS — R000 Tachycardia, unspecified: Secondary | ICD-10-CM | POA: Diagnosis not present

## 2023-06-20 DIAGNOSIS — I499 Cardiac arrhythmia, unspecified: Secondary | ICD-10-CM | POA: Diagnosis not present

## 2023-06-20 DIAGNOSIS — S0993XA Unspecified injury of face, initial encounter: Secondary | ICD-10-CM | POA: Diagnosis not present

## 2023-06-20 DIAGNOSIS — Z91148 Patient's other noncompliance with medication regimen for other reason: Secondary | ICD-10-CM | POA: Insufficient documentation

## 2023-06-20 DIAGNOSIS — M5030 Other cervical disc degeneration, unspecified cervical region: Secondary | ICD-10-CM | POA: Diagnosis not present

## 2023-06-20 DIAGNOSIS — R0689 Other abnormalities of breathing: Secondary | ICD-10-CM | POA: Diagnosis not present

## 2023-06-20 DIAGNOSIS — S0990XA Unspecified injury of head, initial encounter: Secondary | ICD-10-CM | POA: Diagnosis not present

## 2023-06-20 DIAGNOSIS — I10 Essential (primary) hypertension: Secondary | ICD-10-CM | POA: Diagnosis not present

## 2023-06-20 DIAGNOSIS — R4020 Unspecified coma: Secondary | ICD-10-CM

## 2023-06-20 DIAGNOSIS — S199XXA Unspecified injury of neck, initial encounter: Secondary | ICD-10-CM | POA: Diagnosis not present

## 2023-06-20 DIAGNOSIS — J90 Pleural effusion, not elsewhere classified: Secondary | ICD-10-CM | POA: Diagnosis not present

## 2023-06-20 DIAGNOSIS — R519 Headache, unspecified: Secondary | ICD-10-CM | POA: Insufficient documentation

## 2023-06-20 DIAGNOSIS — W19XXXA Unspecified fall, initial encounter: Secondary | ICD-10-CM | POA: Diagnosis not present

## 2023-06-20 DIAGNOSIS — R55 Syncope and collapse: Secondary | ICD-10-CM | POA: Diagnosis not present

## 2023-06-20 LAB — URINALYSIS, ROUTINE W REFLEX MICROSCOPIC
Bilirubin Urine: NEGATIVE
Glucose, UA: NEGATIVE mg/dL
Hgb urine dipstick: NEGATIVE
Ketones, ur: NEGATIVE mg/dL
Leukocytes,Ua: NEGATIVE
Nitrite: NEGATIVE
Protein, ur: NEGATIVE mg/dL
Specific Gravity, Urine: 1.009 (ref 1.005–1.030)
pH: 7 (ref 5.0–8.0)

## 2023-06-20 LAB — CBC WITH DIFFERENTIAL/PLATELET
Abs Immature Granulocytes: 0.01 10*3/uL (ref 0.00–0.07)
Basophils Absolute: 0 10*3/uL (ref 0.0–0.1)
Basophils Relative: 1 %
Eosinophils Absolute: 0.1 10*3/uL (ref 0.0–0.5)
Eosinophils Relative: 1 %
HCT: 44.1 % (ref 36.0–46.0)
Hemoglobin: 14.1 g/dL (ref 12.0–15.0)
Immature Granulocytes: 0 %
Lymphocytes Relative: 17 %
Lymphs Abs: 0.8 10*3/uL (ref 0.7–4.0)
MCH: 29.7 pg (ref 26.0–34.0)
MCHC: 32 g/dL (ref 30.0–36.0)
MCV: 92.8 fL (ref 80.0–100.0)
Monocytes Absolute: 0.4 10*3/uL (ref 0.1–1.0)
Monocytes Relative: 9 %
Neutro Abs: 3.4 10*3/uL (ref 1.7–7.7)
Neutrophils Relative %: 72 %
Platelets: 180 10*3/uL (ref 150–400)
RBC: 4.75 MIL/uL (ref 3.87–5.11)
RDW: 11.9 % (ref 11.5–15.5)
WBC: 4.8 10*3/uL (ref 4.0–10.5)
nRBC: 0 % (ref 0.0–0.2)

## 2023-06-20 LAB — COMPREHENSIVE METABOLIC PANEL WITH GFR
ALT: 11 U/L (ref 0–44)
AST: 27 U/L (ref 15–41)
Albumin: 3.7 g/dL (ref 3.5–5.0)
Alkaline Phosphatase: 71 U/L (ref 38–126)
Anion gap: 7 (ref 5–15)
BUN: 17 mg/dL (ref 8–23)
CO2: 28 mmol/L (ref 22–32)
Calcium: 9.5 mg/dL (ref 8.9–10.3)
Chloride: 105 mmol/L (ref 98–111)
Creatinine, Ser: 0.74 mg/dL (ref 0.44–1.00)
GFR, Estimated: >60 mL/min (ref >60)
Glucose, Bld: 133 mg/dL — ABNORMAL HIGH (ref 70–99)
Potassium: 4.6 mmol/L (ref 3.5–5.1)
Sodium: 140 mmol/L (ref 135–145)
Total Bilirubin: 1.4 mg/dL — ABNORMAL HIGH (ref 0.0–1.2)
Total Protein: 6.9 g/dL (ref 6.5–8.1)

## 2023-06-20 LAB — MAGNESIUM: Magnesium: 2.1 mg/dL (ref 1.7–2.4)

## 2023-06-20 MED ORDER — LAMOTRIGINE 25 MG PO TABS: 50.0000 mg | ORAL_TABLET | Freq: Every day | ORAL | 1 refills | Status: AC

## 2023-06-20 MED ORDER — LAMOTRIGINE 25 MG PO TABS
25.0000 mg | ORAL_TABLET | Freq: Once | ORAL | Status: AC
  Administered 2023-06-21: 25 mg via ORAL
  Filled 2023-06-20: qty 1

## 2023-06-20 MED ORDER — LAMOTRIGINE 25 MG PO TABS
25.0000 mg | ORAL_TABLET | Freq: Once | ORAL | Status: AC
  Administered 2023-06-20: 25 mg via ORAL
  Filled 2023-06-20: qty 1

## 2023-06-20 NOTE — ED Notes (Signed)
 Urine at bedside.

## 2023-06-20 NOTE — ED Provider Notes (Signed)
 [place] EMERGENCY DEPARTMENT AT [facility] Provider Note   CSN: [id number] Arrival date & time: 06/20/23  1736     History  Chief Complaint  Patient presents with   Loss of Consciousness    Tyisha Dakiyah Heinke is a 88 y.o. female.  HPI   88 year old female with past medical history of seizures, not compliant with Lamictal  presents to the emergency department after an episode of loss of consciousness.  Patient states that she remembers going to the bathroom and sitting on the toilet.  She then woke up on the ground with her son.  He states that he found her on the floor.  She has an abrasion to the left side of the head.  He  saw some generalized shaking when he walked into the room.  Otherwise there was no noted tongue biting or incontinence.  Patient states that she has been in her usual state of health this morning, no acute changes.  Denies any fever, chest pain, shortness of breath, neck/extremity pain.  Home Medications Prior to Admission medications   Medication Sig Start Date End Date Taking? Authorizing Provider  furosemide  (LASIX ) 20 MG tablet TAKE 1 TABLET BY MOUTH EVERY DAY 08/11/20   Donley Furth, MD  lamoTRIgine  (LAMICTAL ) 25 MG tablet TAKE 2 TABLETS BY MOUTH EVERY DAY 08/12/21   Donley Furth, MD  meclizine  (ANTIVERT ) 25 MG tablet Take 1 tablet (25 mg total) by mouth every 4 (four) hours as needed for dizziness. 04/20/17   Donley Furth, MD  misoprostol  (CYTOTEC ) 200 MCG tablet Place 2 tablets vaginally 6-12 hours prior to your office visit 09/16/20   Jertson, Jill Evelyn, MD  Multiple Vitamin (MULTIVITAMIN) tablet Take 1 tablet by mouth daily.    [provider]  naproxen sodium (ALEVE) 220 MG tablet Take 220 mg by mouth.    [provider]  trolamine salicylate (ASPERCREME) 10 % cream Apply 1 application topically as needed (for knees).    [provider]      Allergies    Patient has no known allergies.    Review of Systems    Review of Systems  Constitutional:  Positive for fatigue. Negative for fever.  Respiratory:  Negative for shortness of breath.   Cardiovascular:  Negative for chest pain.  Gastrointestinal:  Negative for abdominal pain, diarrhea and vomiting.  Skin:  Negative for rash.  Neurological:  Positive for syncope. Negative for headaches.    Physical Exam Updated Vital Signs BP (!) 157/78   Pulse 99   Temp 99.1 F (37.3 C) (Oral)   Resp 20   Ht 5\' 2"  (1.575 m)   Wt 59 kg   SpO2 99%   BMI 23.78 kg/m  Physical Exam Vitals and nursing note reviewed.  Constitutional:      General: She is not in acute distress.    Appearance: Normal appearance.  HENT:     Head: Normocephalic.     Mouth/Throat:     Mouth: Mucous membranes are moist.  Eyes:     Extraocular Movements: Extraocular movements intact.     Pupils: Pupils are equal, round, and reactive to light.  Cardiovascular:     Rate and Rhythm: Normal rate.  Pulmonary:     Effort: Pulmonary effort is normal. No respiratory distress.  Abdominal:     Palpations: Abdomen is soft.     Tenderness: There is no abdominal tenderness.  Musculoskeletal:        General: No swelling or  deformity.     Cervical back: Neck supple. No rigidity.  Skin:    General: Skin is warm.  Neurological:     Mental Status: She is alert and oriented to person, place, and time. Mental status is at baseline.  Psychiatric:        Mood and Affect: Mood normal.     ED Results / Procedures / Treatments   Labs (all labs ordered are listed, but only abnormal results are displayed) Labs Reviewed - No data to display  EKG EKG Interpretation Date/Time:  Monday June 20 2023 17:49:47 EDT Ventricular Rate:  110 PR Interval:  155 QRS Duration:  80 QT Interval:  343 QTC Calculation: 464 R Axis:   83  Text Interpretation: Sinus tachycardia Consider right atrial enlargement Borderline right axis deviation Low voltage, precordial leads Baseline wander in lead(s)  V6 Confirmed by Florentino Hurdle ([id number]) on 06/20/2023 6:04:20 PM  Radiology No results found.  Procedures Procedures    Medications Ordered in ED Medications - No data to display  ED Course/ Medical Decision Making/ A&P                                 Medical Decision Making Amount and/or Complexity of Data Reviewed Labs: ordered. Radiology: ordered.  Risk Prescription drug management.   88 year old female with past medical history of seizures, noncompliant with medication presents emergency department after an episode of loss of consciousness.  Witnessed shaking by son.    Patient was tachycardic on arrival.  Report from EMS is that she did have a run of SVT but she has sinus tachycardia on arrival here.  Stable blood pressure.  She has a mild headache but otherwise denies any acute chest pain or symptoms.  No ongoing shortness of breath or leg swelling.  EKG has no ischemic changes.  Chest x-ray is unremarkable.  CT imaging shows no acute finding.  Blood work is normal without any acute abnormalities.  Lamictal  level is pending but will not change management.  Patient appears to be presenting with a possible breakthrough seizure secondary to medication noncompliance.  Seems less likely for other etiology/syncope like PE.  She has no shortness of breath on exam.  Patient was given a dose of her Lamictal  medicine here and a new prescription has been sent.  I discussed with the patient and family member at bedside the importance of taking the medication as prescribed to prevent breakthrough seizures.  They both understand.  On reevaluation vitals have normalized, she feels well.  She has no further acute complaints and is requesting discharge.  Patient at this time appears safe and stable for discharge and close outpatient follow up. Discharge plan and strict return to ED precautions discussed, patient verbalizes understanding and agreement.        Final Clinical Impression(s) /  ED Diagnoses Final diagnoses:  None    Rx / DC Orders ED Discharge Orders     None         Flonnie Humphrey, DO 06/20/23 2358

## 2023-06-20 NOTE — ED Triage Notes (Signed)
 BIB [facility] from home, patient had a syncope episode with positive LOC discovered by son. Patient denies any dizziness, blurry vision, headache before the fall, EMS reported patient to have HR of 160 and self converted to 112.   BP 162/80 HR 160 to 112 98%RA 112 cbg

## 2023-06-20 NOTE — Discharge Instructions (Addendum)
 You have been seen and discharged from the emergency department.  It appears he could have had a breakthrough seizure from not taking your seizure medication.  You were given a dose here in the department.  A new prescription has been sent for you.  It is extremely important that you fill and take this medication as directed to prevent any further seizures.  Follow-up with your primary provider for further evaluation and further care. Take home medications as prescribed. If you have any worsening symptoms or further concerns for your health please return to an emergency department for further evaluation.

## 2023-06-21 ENCOUNTER — Telehealth: Payer: Self-pay | Admitting: *Deleted

## 2023-06-21 DIAGNOSIS — R519 Headache, unspecified: Secondary | ICD-10-CM | POA: Diagnosis not present

## 2023-06-21 DIAGNOSIS — R55 Syncope and collapse: Secondary | ICD-10-CM | POA: Diagnosis not present

## 2023-06-21 DIAGNOSIS — Z91148 Patient's other noncompliance with medication regimen for other reason: Secondary | ICD-10-CM | POA: Diagnosis not present

## 2023-06-21 NOTE — Transitions of Care (Post Inpatient/ED Visit) (Signed)
   06/21/2023  Name: Debra Carlson MRN: [id number] DOB: 19-May-1935  Today's TOC FU Call Status: Today's TOC FU Call Status:: Successful TOC FU Call Completed TOC FU Call Complete Date: 06/21/23 Patient's Name and Date of Birth confirmed.  Transition Care Management Follow-up Telephone Call Date of Discharge: 06/21/23 Discharge Facility: [facility] Type of Discharge: Emergency Department Reason for ED Visit: Other: (syncope) How have you been since you were released from the hospital?: Better (some pain with left side of head) Any questions or concerns?: No  Items Reviewed: Did you receive and understand the discharge instructions provided?: Yes Medications obtained,verified, and reconciled?: Yes (Medications Reviewed) Any new allergies since your discharge?: No Dietary orders reviewed?: Yes Do you have support at home?: Yes People in Home [RPT]: child(ren), adult  Medications Reviewed Today: Medications Reviewed Today     Reviewed by Adron Albino, CMA (Certified Medical Assistant) on 06/21/23 at 1111  Med List Status: <None>   Medication Order Taking? Sig Documenting Provider Last Dose Status Informant  furosemide  (LASIX ) 20 MG tablet [id number] Yes TAKE 1 TABLET BY MOUTH EVERY DAY Donley Furth, MD Taking Active   lamoTRIgine  (LAMICTAL ) 25 MG tablet [id number] Yes Take 2 tablets (50 mg total) by mouth daily. Horton, Sidra Dredge, DO Taking Active   meclizine  (ANTIVERT ) 25 MG tablet [id number] Yes Take 1 tablet (25 mg total) by mouth every 4 (four) hours as needed for dizziness. Donley Furth, MD Taking Active   misoprostol  (CYTOTEC ) 200 MCG tablet [id number] No Place 2 tablets vaginally 6-12 hours prior to your office visit  Patient not taking: Reported on 06/21/2023   Jertson, Jill Evelyn, MD Not Taking Active   Multiple Vitamin (MULTIVITAMIN) tablet [id number] Yes Take 1 tablet by mouth daily. [provider] Taking Active Self  naproxen sodium (ALEVE) 220 MG  tablet [id number] Yes Take 220 mg by mouth. [provider] Taking Active   trolamine salicylate (ASPERCREME) 10 % cream [id number] Yes Apply 1 application topically as needed (for knees). [provider] Taking Active Self            Home Care and Equipment/Supplies: Were Home Health Services Ordered?: No Any new equipment or medical supplies ordered?: No  Functional Questionnaire: Do you need assistance with bathing/showering or dressing?: No Do you need assistance with meal preparation?: No Do you need assistance with eating?: No Do you have difficulty maintaining continence: No Do you need assistance with getting out of bed/getting out of a chair/moving?: No Do you have difficulty managing or taking your medications?: No  Follow up appointments reviewed: PCP Follow-up appointment confirmed?: Yes Date of PCP follow-up appointment?: 07/04/23 Follow-up Provider: Alyne Babinski MD Specialist Hospital Follow-up appointment confirmed?: No Reason Specialist Follow-Up Not Confirmed: Patient has Specialist Provider Number and will Call for Appointment Do you need transportation to your follow-up appointment?: No Do you understand care options if your condition(s) worsen?: Yes-patient verbalized understanding    SIGNATURE: Nicolina Barrios, CMA

## 2023-06-22 LAB — LAMOTRIGINE LEVEL: Lamotrigine Lvl: <1 ug/mL — ABNORMAL LOW (ref 2.0–20.0)

## 2023-06-23 ENCOUNTER — Telehealth: Payer: Self-pay

## 2023-06-23 NOTE — Transitions of Care (Post Inpatient/ED Visit) (Signed)
   06/23/2023  Name: Debra Carlson MRN: [id number] DOB: 02-06-1935  Today's TOC FU Call Status: Today's TOC FU Call Status:: Successful TOC FU Call Completed Patient's Name and Date of Birth confirmed.  Transition Care Management Follow-up Telephone Call    Items Reviewed:    Medications Reviewed Today: Medications Reviewed Today   Medications were not reviewed in this encounter     Home Care and Equipment/Supplies:    Functional Questionnaire:    Follow up appointments reviewed:   Contacted pt. Pt reports she already has an upcoming appt with Dr. Alyne Babinski. Appt date confirmed.    SIGNATURE: Yekaterina Escutia, CMA

## 2023-07-04 ENCOUNTER — Ambulatory Visit (INDEPENDENT_AMBULATORY_CARE_PROVIDER_SITE_OTHER): Admitting: Family Medicine

## 2023-07-04 ENCOUNTER — Encounter: Payer: Self-pay | Admitting: Family Medicine

## 2023-07-04 VITALS — BP 130/74 | HR 87 | Temp 98.5°F | Wt 156.6 lb

## 2023-07-04 DIAGNOSIS — R55 Syncope and collapse: Secondary | ICD-10-CM

## 2023-07-04 DIAGNOSIS — G40209 Localization-related (focal) (partial) symptomatic epilepsy and epileptic syndromes with complex partial seizures, not intractable, without status epilepticus: Secondary | ICD-10-CM | POA: Diagnosis not present

## 2023-07-04 MED ORDER — FUROSEMIDE 20 MG PO TABS: 20.0000 mg | ORAL_TABLET | Freq: Every day | ORAL | 1 refills | Status: DC | PRN

## 2023-07-04 NOTE — Progress Notes (Signed)
   Subjective:    Patient ID: Debra Carlson, female    DOB: 1935-02-05, 88 y.o.   MRN: [id number]  HPI Here for a transitional care visit to follow up an ED visit on 06-20-23 after she passed out at home. That day she had used the bathroom and she was in the midst of pulling up her pants that she apparently lost consciousness and fell. Her son heard the fall and he found her on the bathroom floor. He witnessed a generalized shaking for a minute or two which then stopped. She fairly quickly regained consciousness,  and she was able to speak to him and walk with assistance. She had a bruise on the left forehead but no other visible injuries. At the ED her exam was normal. The EKG showed sinus tachycardia. Labs were significant for a normal creatinine at 0.74. The CXR was clear. She also had CT scans of the head, neck, and maxillofacial areas, and these were all normal. They determined that she had not been taking the Lamictal  for some time even though she has a hx of seizures. She estimates her last seizure occurred about 5 years ago. She was sent home on Lamictal  50 mg daily, and she has done well since then. Her son drove her today.    Review of Systems  Constitutional: Negative.   Respiratory: Negative.    Cardiovascular: Negative.   Gastrointestinal: Negative.   Genitourinary: Negative.   Neurological:  Positive for syncope. Negative for dizziness, weakness, numbness and headaches.       Objective:   Physical Exam Constitutional:      Appearance: Normal appearance.     Comments: Walks with a cane   Cardiovascular:     Rate and Rhythm: Normal rate and regular rhythm.     Pulses: Normal pulses.     Heart sounds: Normal heart sounds.  Pulmonary:     Effort: Pulmonary effort is normal.     Breath sounds: Normal breath sounds.   Neurological:     Mental Status: She is alert and oriented to person, place, and time. Mental status is at baseline.           Assessment & Plan:   She had a syncopal spell that was likely due to a seizure caused by medication non-adherence. She is now back on Lamictal  daily. She will follow up as needed. We spent a total of ( 34  ) minutes reviewing records and discussing these issues.  Garnette Olmsted, MD

## 2023-08-05 ENCOUNTER — Ambulatory Visit: Payer: Medicare HMO

## 2023-08-05 VITALS — Ht 62.0 in | Wt 156.0 lb

## 2023-08-05 DIAGNOSIS — Z0000 Encounter for general adult medical examination without abnormal findings: Secondary | ICD-10-CM | POA: Diagnosis not present

## 2023-08-05 NOTE — Progress Notes (Signed)
 Subjective:   Debra Carlson is a 88 y.o. who presents for a Medicare Wellness preventive visit.  As a reminder, Annual Wellness Visits don't include a physical exam, and some assessments may be limited, especially if this visit is performed virtually. We may recommend an in-person follow-up visit with your provider if needed.  Visit Complete: Virtual I connected with  Kenneth Earnie Fonder on 08/05/23 by a audio enabled telemedicine application and verified that I am speaking with the correct person using two identifiers.  Patient Location: Home  Provider Location: Home Office  I discussed the limitations of evaluation and management by telemedicine. The patient expressed understanding and agreed to proceed.  Vital Signs: Because this visit was a virtual/telehealth visit, some criteria may be missing or patient reported. Any vitals not documented were not able to be obtained and vitals that have been documented are patient reported.    Persons Participating in Visit: Patient.  AWV Questionnaire: No: Patient Medicare AWV questionnaire was not completed prior to this visit.  Cardiac Risk Factors include: advanced age (>61men, >47 women);hypertension     Objective:    Today's Vitals   08/05/23 1350  Weight: 156 lb (70.8 kg)  Height: 5' 2 (1.575 m)   Body mass index is 28.53 kg/m.     08/05/2023    1:57 PM 06/20/2023    5:56 PM 07/26/2022    2:39 PM 07/20/2021    1:16 PM 04/23/2020    3:38 PM 08/14/2014    1:18 PM 07/12/2014    8:28 PM  Advanced Directives  Does Patient Have a Medical Advance Directive? No No No No Yes No  No   Type of Agricultural consultant;Living will    Does patient want to make changes to medical advance directive?     No - Patient declined    Copy of Healthcare Power of Attorney in Chart?     No - copy requested    Would patient like information on creating a medical advance directive? No - Patient declined No - Patient  declined No - Patient declined No - Patient declined   No - patient declined information      Data saved with a previous flowsheet row definition    Current Medications (verified) Outpatient Encounter Medications as of 08/05/2023  Medication Sig   furosemide  (LASIX ) 20 MG tablet Take 1 tablet (20 mg total) by mouth daily as needed for fluid.   lamoTRIgine  (LAMICTAL ) 25 MG tablet Take 2 tablets (50 mg total) by mouth daily.   meclizine  (ANTIVERT ) 25 MG tablet Take 1 tablet (25 mg total) by mouth every 4 (four) hours as needed for dizziness.   Multiple Vitamin (MULTIVITAMIN) tablet Take 1 tablet by mouth daily.   naproxen sodium (ALEVE) 220 MG tablet Take 220 mg by mouth.   trolamine salicylate (ASPERCREME) 10 % cream Apply 1 application topically as needed (for knees).   No facility-administered encounter medications on file as of 08/05/2023.    Allergies (verified) Patient has no known allergies.   History: Past Medical History:  Diagnosis Date   Cataract    Hypertension    Osteoarthritis    Seizure disorder (HCC)    single seizure in 2005, sees Dr. Ozell Hock   Seizures [facility])    last seizure in 2011   Past Surgical History:  Procedure Laterality Date   CESAREAN SECTION     TONSILLECTOMY     Family History  Problem  Relation Age of Onset   Colon cancer Mother    Heart attack Father    Hypertension Maternal Aunt    Breast cancer Maternal Aunt 53   Social History   Socioeconomic History   Marital status: Married    Spouse name: Not on file   Number of children: 1   Years of education: 12+   Highest education level: Not on file  Occupational History   Occupation: Retired   Tobacco Use   Smoking status: Never   Smokeless tobacco: Never  Substance and Sexual Activity   Alcohol use: No    Alcohol/week: 0.0 standard drinks of alcohol   Drug use: No   Sexual activity: Not Currently    Partners: Male    Birth control/protection: Post-menopausal  Other Topics  Concern   Not on file  Social History Narrative   Married    Never Smoked   Alcohol use- no   Drug use- no         Social Drivers of Corporate investment banker Strain: Low Risk  (08/05/2023)   Overall Financial Resource Strain (CARDIA)    Difficulty of Paying Living Expenses: Not hard at all  Food Insecurity: No Food Insecurity (08/05/2023)   Hunger Vital Sign    Worried About Running Out of Food in the Last Year: Never true    Ran Out of Food in the Last Year: Never true  Transportation Needs: No Transportation Needs (08/05/2023)   PRAPARE - Administrator, Civil Service (Medical): No    Lack of Transportation (Non-Medical): No  Physical Activity: Inactive (08/05/2023)   Exercise Vital Sign    Days of Exercise per Week: 0 days    Minutes of Exercise per Session: 0 min  Stress: No Stress Concern Present (08/05/2023)   [employer] of Occupational Health - Occupational Stress Questionnaire    Feeling of Stress: Not at all  Social Connections: Moderately Integrated (08/05/2023)   Social Connection and Isolation Panel    Frequency of Communication with Friends and Family: More than three times a week    Frequency of Social Gatherings with Friends and Family: More than three times a week    Attends Religious Services: More than 4 times per year    Active Member of [employer] or Organizations: Yes    Attends Banker Meetings: More than 4 times per year    Marital Status: Widowed    Tobacco Counseling Counseling given: Not Answered    Clinical Intake:  Pre-visit preparation completed: Yes  Pain : No/denies pain     BMI - recorded: 28.53 Nutritional Status: BMI 25 -29 Overweight Nutritional Risks: None Diabetes: No  Lab Results  Component Value Date   HGBA1C 6.4 05/15/2020   HGBA1C 6.1 (H) 12/10/2005     How often do you need to have someone help you when you read instructions, pamphlets, or other written materials from your doctor or  pharmacy?: 1 - Never  Interpreter Needed?: No  Information entered by :: Rojelio Blush LPN   Activities of Daily Living     08/05/2023    1:54 PM  In your present state of health, do you have any difficulty performing the following activities:  Hearing? 0  Vision? 0  Difficulty concentrating or making decisions? 0  Walking or climbing stairs? 1  Comment Uses a Cane  Dressing or bathing? 0  Doing errands, shopping? 0  Preparing Food and eating ? N  Using the Toilet? N  In the past six months, have you accidently leaked urine? Y  Comment Wears Depends. Followed by PCP  Do you have problems with loss of bowel control? N  Managing your Medications? N  Managing your Finances? N  Housekeeping or managing your Housekeeping? N    Patient Care Team: Johnny Garnette LABOR, MD as PCP - General  I have updated your Care Teams any recent Medical Services you may have received from other providers in the past year.     Assessment:   This is a routine wellness examination for Klynn.  Hearing/Vision screen Hearing Screening - Comments:: Denies hearing difficulties   Vision Screening - Comments:: Wears rx glasses - up to date with routine eye exams. Deferred   Goals Addressed               This Visit's Progress     Get More active (pt-stated)         Depression Screen     08/05/2023    1:53 PM 07/26/2022    2:37 PM 07/20/2021    1:12 PM 04/23/2020    3:40 PM 04/23/2020    3:36 PM 12/25/2015   12:12 PM 07/29/2014   11:29 AM  PHQ 2/9 Scores  PHQ - 2 Score 0 0 0 0 0 0 0    Fall Risk     08/05/2023    1:55 PM 07/04/2023    2:47 PM 07/26/2022    2:38 PM 07/20/2021    1:15 PM 04/23/2020    3:40 PM  Fall Risk   Falls in the past year? 1 1 0 0 0  Number falls in past yr: 0 0 0 0 0  Injury with Fall? 0 1 0 0 0  Comment Followed by medical attention Skin tear     Risk for fall due to : No Fall Risks History of fall(s) No Fall Risks No Fall Risks   Follow up Falls evaluation  completed Falls evaluation completed Falls prevention discussed  Falls evaluation completed      Data saved with a previous flowsheet row definition    MEDICARE RISK AT HOME:  Medicare Risk at Home Any stairs in or around the home?: No If so, are there any without handrails?: No Home free of loose throw rugs in walkways, pet beds, electrical cords, etc?: Yes Adequate lighting in your home to reduce risk of falls?: Yes Life alert?: Yes Use of a cane, walker or w/c?: Yes Grab bars in the bathroom?: Yes Shower chair or bench in shower?: Yes Elevated toilet seat or a handicapped toilet?: Yes  TIMED UP AND GO:  Was the test performed?  No  Cognitive Function: 6CIT completed        08/05/2023    1:57 PM 07/26/2022    2:39 PM 07/20/2021    1:16 PM  6CIT Screen  What Year? 0 points 0 points 0 points  What month? 0 points 0 points 0 points  What time? 0 points 0 points 0 points  Count back from 20 0 points 0 points 0 points  Months in reverse 0 points 0 points 0 points  Repeat phrase 0 points 0 points 0 points  Total Score 0 points 0 points 0 points    Immunizations Immunization History  Administered Date(s) Administered   PFIZER(Purple Top)SARS-COV-2 Vaccination 08/18/2019   Tdap 05/20/2017    Screening Tests Health Maintenance  Topic Date Due   Pneumococcal Vaccine: 50+ Years (1 of 1 - PCV) Never done  Zoster Vaccines- Shingrix (1 of 2) Never done   DEXA SCAN  Never done   COVID-19 Vaccine (2 - 2024-25 season) 09/12/2022   INFLUENZA VACCINE  02/12/2034 (Originally 08/12/2023)   Medicare Annual Wellness (AWV)  08/04/2024   DTaP/Tdap/Td (2 - Td or Tdap) 05/21/2027   Hepatitis B Vaccines  Aged Out   HPV VACCINES  Aged Out   Meningococcal B Vaccine  Aged Out    Health Maintenance  Health Maintenance Due  Topic Date Due   Pneumococcal Vaccine: 50+ Years (1 of 1 - PCV) Never done   Zoster Vaccines- Shingrix (1 of 2) Never done   DEXA SCAN  Never done   COVID-19  Vaccine (2 - 2024-25 season) 09/12/2022   Health Maintenance Items Addressed:   Additional Screening:  Vision Screening: Recommended annual ophthalmology exams for early detection of glaucoma and other disorders of the eye. Would you like a referral to an eye doctor? No    Dental Screening: Recommended annual dental exams for proper oral hygiene  Community Resource Referral / Chronic Care Management: CRR required this visit?  No   CCM required this visit?  No   Plan:    I have personally reviewed and noted the following in the patient's chart:   Medical and social history Use of alcohol, tobacco or illicit drugs  Current medications and supplements including opioid prescriptions. Patient is not currently taking opioid prescriptions. Functional ability and status Nutritional status Physical activity Advanced directives List of other physicians Hospitalizations, surgeries, and ER visits in previous 12 months Vitals Screenings to include cognitive, depression, and falls Referrals and appointments  In addition, I have reviewed and discussed with patient certain preventive protocols, quality metrics, and best practice recommendations. A written personalized care plan for preventive services as well as general preventive health recommendations were provided to patient.   Rojelio LELON Blush, LPN   [id number]   After Visit Summary: (MyChart) Due to this being a telephonic visit, the after visit summary with patients personalized plan was offered to patient via MyChart   Notes: Nothing significant to report at this time.

## 2023-08-05 NOTE — Patient Instructions (Addendum)
 [name] , Thank you for taking time out of your busy schedule to complete your Annual Wellness Visit with me. I enjoyed our conversation and look forward to speaking with you again next year. I, as well as your care team,  appreciate your ongoing commitment to your health goals. Please review the following plan we discussed and let me know if I can assist you in the future. Your Game plan/ To Do List    Referrals: If you haven't heard from the office you've been referred to, please reach out to them at the phone provided.   Follow up Visits: Next Medicare AWV with our clinical staff: 08/10/24 @ 1:40p   Have you seen your provider in the last 6 months (3 months if uncontrolled diabetes)? 07/04/23 Next Office Visit with your provider:   Clinician Recommendations:  Aim for 30 minutes of exercise or brisk walking, 6-8 glasses of water, and 5 servings of fruits and vegetables each day.       This is a list of the screening recommended for you and due dates:  Health Maintenance  Topic Date Due   Pneumococcal Vaccine for age over 41 (1 of 1 - PCV) Never done   Zoster (Shingles) Vaccine (1 of 2) Never done   DEXA scan (bone density measurement)  Never done   COVID-19 Vaccine (2 - 2024-25 season) 09/12/2022   Flu Shot  02/12/2034*   Medicare Annual Wellness Visit  08/04/2024   DTaP/Tdap/Td vaccine (2 - Td or Tdap) 05/21/2027   Hepatitis B Vaccine  Aged Out   HPV Vaccine  Aged Out   Meningitis B Vaccine  Aged Out  *Topic was postponed. The date shown is not the original due date.    Advanced directives: (Declined) Advance directive discussed with you today. Even though you declined this today, please call our office should you change your mind, and we can give you the proper paperwork for you to fill out. Advance Care Planning is important because it:  [x]  Makes sure you receive the medical care that is consistent with your values, goals, and preferences  [x]  It provides guidance to your  family and loved ones and reduces their decisional burden about whether or not they are making the right decisions based on your wishes.  Follow the link provided in your after visit summary or read over the paperwork we have mailed to you to help you started getting your Advance Directives in place. If you need assistance in completing these, please reach out to us  so that we can help you!  See attachments for Preventive Care and Fall Prevention Tips.

## 2023-12-01 DIAGNOSIS — I110 Hypertensive heart disease with heart failure: Secondary | ICD-10-CM | POA: Diagnosis not present

## 2023-12-01 DIAGNOSIS — G40909 Epilepsy, unspecified, not intractable, without status epilepticus: Secondary | ICD-10-CM | POA: Diagnosis not present

## 2023-12-01 DIAGNOSIS — R32 Unspecified urinary incontinence: Secondary | ICD-10-CM | POA: Diagnosis not present

## 2023-12-01 DIAGNOSIS — Z809 Family history of malignant neoplasm, unspecified: Secondary | ICD-10-CM | POA: Diagnosis not present

## 2023-12-01 DIAGNOSIS — I509 Heart failure, unspecified: Secondary | ICD-10-CM | POA: Diagnosis not present

## 2023-12-01 DIAGNOSIS — G629 Polyneuropathy, unspecified: Secondary | ICD-10-CM | POA: Diagnosis not present

## 2023-12-01 DIAGNOSIS — K056 Periodontal disease, unspecified: Secondary | ICD-10-CM | POA: Diagnosis not present

## 2024-01-03 ENCOUNTER — Other Ambulatory Visit: Payer: Self-pay | Admitting: Family Medicine

## 2024-08-10 ENCOUNTER — Ambulatory Visit
